# Patient Record
Sex: Male | Born: 1955 | Race: White | Hispanic: No | Marital: Married | Smoking: Current some day smoker
Health system: Southern US, Community
[De-identification: ages and names within clinical notes are randomized; demographics above are authoritative.]

## PROBLEM LIST (undated history)

## (undated) DIAGNOSIS — C4491 Basal cell carcinoma of skin, unspecified: Secondary | ICD-10-CM

## (undated) DIAGNOSIS — Z8719 Personal history of other diseases of the digestive system: Secondary | ICD-10-CM

## (undated) DIAGNOSIS — N419 Inflammatory disease of prostate, unspecified: Secondary | ICD-10-CM

## (undated) DIAGNOSIS — D239 Other benign neoplasm of skin, unspecified: Secondary | ICD-10-CM

## (undated) DIAGNOSIS — M51369 Other intervertebral disc degeneration, lumbar region without mention of lumbar back pain or lower extremity pain: Secondary | ICD-10-CM

## (undated) DIAGNOSIS — M5136 Other intervertebral disc degeneration, lumbar region: Secondary | ICD-10-CM

## (undated) DIAGNOSIS — E669 Obesity, unspecified: Secondary | ICD-10-CM

## (undated) DIAGNOSIS — K5792 Diverticulitis of intestine, part unspecified, without perforation or abscess without bleeding: Secondary | ICD-10-CM

## (undated) DIAGNOSIS — T7840XA Allergy, unspecified, initial encounter: Secondary | ICD-10-CM

## (undated) DIAGNOSIS — N400 Enlarged prostate without lower urinary tract symptoms: Secondary | ICD-10-CM

## (undated) DIAGNOSIS — Z720 Tobacco use: Secondary | ICD-10-CM

## (undated) HISTORY — DX: Other benign neoplasm of skin, unspecified: D23.9

## (undated) HISTORY — DX: Diverticulitis of intestine, part unspecified, without perforation or abscess without bleeding: K57.92

## (undated) HISTORY — DX: Benign prostatic hyperplasia without lower urinary tract symptoms: N40.0

## (undated) HISTORY — DX: Other intervertebral disc degeneration, lumbar region without mention of lumbar back pain or lower extremity pain: M51.369

## (undated) HISTORY — DX: Allergy, unspecified, initial encounter: T78.40XA

## (undated) HISTORY — DX: Inflammatory disease of prostate, unspecified: N41.9

## (undated) HISTORY — DX: Obesity, unspecified: E66.9

## (undated) HISTORY — DX: Basal cell carcinoma of skin, unspecified: C44.91

## (undated) HISTORY — PX: COLONOSCOPY: SHX174

## (undated) HISTORY — DX: Other intervertebral disc degeneration, lumbar region: M51.36

## (undated) HISTORY — DX: Tobacco use: Z72.0

## (undated) HISTORY — PX: BASAL CELL CARCINOMA EXCISION: SHX1214

## (undated) HISTORY — DX: Personal history of other diseases of the digestive system: Z87.19

---

## 2005-05-31 DIAGNOSIS — N419 Inflammatory disease of prostate, unspecified: Secondary | ICD-10-CM

## 2005-05-31 HISTORY — DX: Inflammatory disease of prostate, unspecified: N41.9

## 2008-02-06 ENCOUNTER — Ambulatory Visit (HOSPITAL_BASED_OUTPATIENT_CLINIC_OR_DEPARTMENT_OTHER): Admission: RE | Admit: 2008-02-06 | Discharge: 2008-02-06 | Payer: Self-pay | Admitting: General Surgery

## 2008-05-31 HISTORY — PX: UMBILICAL HERNIA REPAIR: SHX196

## 2009-03-20 ENCOUNTER — Emergency Department (HOSPITAL_COMMUNITY): Admission: EM | Admit: 2009-03-20 | Discharge: 2009-03-20 | Payer: Self-pay | Admitting: Emergency Medicine

## 2010-05-31 DIAGNOSIS — K5792 Diverticulitis of intestine, part unspecified, without perforation or abscess without bleeding: Secondary | ICD-10-CM

## 2010-05-31 HISTORY — DX: Diverticulitis of intestine, part unspecified, without perforation or abscess without bleeding: K57.92

## 2010-10-13 NOTE — Op Note (Signed)
[name], Chase Morgan               ACCOUNT NO.:  [id number]   MEDICAL RECORD NO.:  [id number]          PATIENT TYPE:  AMB   LOCATION:  DSC                          FACILITY:  [facility]   PHYSICIAN:  Almond Lint, MD       DATE OF BIRTH:  [birth year]   DATE OF PROCEDURE:  02/06/2008  DATE OF DISCHARGE:                               OPERATIVE REPORT   PREOPERATIVE DIAGNOSIS:  Umbilical hernia.   POSTOPERATIVE DIAGNOSIS:  Umbilical hernia.   PROCEDURE PERFORMED:  Umbilical hernia repair with Ventralex 4.5-cm  mesh.   SURGEON:  Almond Lint, MD   ASSISTANT:  None.   ANESTHESIA:  General and local.   FINDINGS:  A 2-cm hernia defect, the umbilicus with omentum and the  hernia sac.   SPECIMENS:  None.   ESTIMATED BLOOD LOSS:  Minimal.   COMPLICATIONS:  None.   PROCEDURE:  Chase Morgan was identified in the holding area and taken to  the operating room where he was placed supine on the operating room  table.  General endotracheal anesthesia was induced.  The abdomen was  prepped and draped in a sterile fashion.  Time-out was performed  confirming the patient, the site of surgery, equipment, operating room  staff, and antibiotic administration.  These were cracked and the  operation was commenced.   Marcaine 0.25% with 1% lidocaine with epinephrine was infiltrated into  the infraumbilical and periumbilical regions.  The incision was then  made in transverse curvilinear infraumbilical location.  The Bovie  electrocautery was used to dissect the subcutaneous fat.  The skin  overlying the hernia was elevated using an Allis clamp.  The  subcutaneous tissue was then dissected away from the hernia sac.  A  finger was passed around posterior to the hernia sac.  A Penrose was  placed around it.  At this point, the skin was taken off the hernia sac.  The fascia was then elevated using Kocher and digital palpation was used  to ensure that there was no adherence of the hernia sac to the  posterior  fascia.  Several bands were released using the Bovie electrocautery.  The hernia was then reduced into the abdomen and eight 2-0 Prolene were  used to secure the Ventralex mesh posterior to the fascia with the Gore-  Tex side down.  These were secured.  The fascia was closed over the mesh  with 2-0 Vicryl interrupted sutures.  The umbilical skin was then  reattached to the fascia using a 3-0 Vicryl pop.  The skin incision was  reapproximated using  3-0 Vicryl in a deep dermal interrupted fashion.  The skin edges were  reapproximated using a running 4-0 Vicryl subcuticular suture.  The  wound was cleaned and then dressed using benzoin, Steri-Strips, gauze,  and Tegaderm.  The patient was awake under anesthesia and taken to PACU  in good condition.      Almond Lint, MD  Electronically Signed     FB/MEDQ  D:  02/06/2008  T:  02/07/2008  Job:  [id number]

## 2010-10-30 ENCOUNTER — Inpatient Hospital Stay (INDEPENDENT_AMBULATORY_CARE_PROVIDER_SITE_OTHER)
Admission: RE | Admit: 2010-10-30 | Discharge: 2010-10-30 | Disposition: A | Discharge status: Home / Self Care | Payer: Self-pay | Source: Ambulatory Visit | Attending: Family Medicine | Admitting: Family Medicine

## 2010-10-30 ENCOUNTER — Ambulatory Visit (INDEPENDENT_AMBULATORY_CARE_PROVIDER_SITE_OTHER): Payer: Self-pay

## 2010-10-30 DIAGNOSIS — R300 Dysuria: Secondary | ICD-10-CM

## 2010-10-30 DIAGNOSIS — K5900 Constipation, unspecified: Secondary | ICD-10-CM

## 2010-10-30 LAB — POCT URINALYSIS DIP (DEVICE)
Nitrite: NEGATIVE
Protein, ur: NEGATIVE mg/dL
Urobilinogen, UA: 0.2 mg/dL (ref 0.0–1.0)
pH: 5.5 (ref 5.0–8.0)

## 2010-10-31 LAB — URINE CULTURE: Culture  Setup Time: 201206011546

## 2010-11-18 ENCOUNTER — Inpatient Hospital Stay (HOSPITAL_COMMUNITY)
Admission: EM | Admit: 2010-11-18 | Discharge: 2010-11-23 | DRG: 392 | Disposition: A | Discharge status: Home / Self Care | Payer: Self-pay | Attending: Internal Medicine | Admitting: Internal Medicine

## 2010-11-18 ENCOUNTER — Encounter: Payer: Self-pay | Admitting: Internal Medicine

## 2010-11-18 ENCOUNTER — Emergency Department (HOSPITAL_COMMUNITY): Payer: Self-pay

## 2010-11-18 DIAGNOSIS — N419 Inflammatory disease of prostate, unspecified: Secondary | ICD-10-CM | POA: Diagnosis present

## 2010-11-18 DIAGNOSIS — E663 Overweight: Secondary | ICD-10-CM | POA: Diagnosis present

## 2010-11-18 DIAGNOSIS — G4730 Sleep apnea, unspecified: Secondary | ICD-10-CM | POA: Diagnosis present

## 2010-11-18 DIAGNOSIS — K612 Anorectal abscess: Secondary | ICD-10-CM | POA: Diagnosis present

## 2010-11-18 DIAGNOSIS — K5732 Diverticulitis of large intestine without perforation or abscess without bleeding: Principal | ICD-10-CM | POA: Diagnosis present

## 2010-11-18 DIAGNOSIS — Z87891 Personal history of nicotine dependence: Secondary | ICD-10-CM

## 2010-11-18 DIAGNOSIS — K5289 Other specified noninfective gastroenteritis and colitis: Secondary | ICD-10-CM | POA: Diagnosis present

## 2010-11-18 DIAGNOSIS — N2889 Other specified disorders of kidney and ureter: Secondary | ICD-10-CM | POA: Diagnosis present

## 2010-11-18 DIAGNOSIS — N135 Crossing vessel and stricture of ureter without hydronephrosis: Secondary | ICD-10-CM | POA: Diagnosis present

## 2010-11-18 DIAGNOSIS — K5900 Constipation, unspecified: Secondary | ICD-10-CM | POA: Diagnosis present

## 2010-11-18 DIAGNOSIS — Z6836 Body mass index (BMI) 36.0-36.9, adult: Secondary | ICD-10-CM

## 2010-11-18 LAB — URINALYSIS, ROUTINE W REFLEX MICROSCOPIC
Ketones, ur: 15 mg/dL — AB
Leukocytes, UA: NEGATIVE
Nitrite: NEGATIVE
pH: 5 (ref 5.0–8.0)

## 2010-11-18 LAB — POCT I-STAT, CHEM 8
BUN: 15 mg/dL (ref 6–23)
Creatinine, Ser: 1 mg/dL (ref 0.50–1.35)
Potassium: 3.9 mEq/L (ref 3.5–5.1)
Sodium: 137 mEq/L (ref 135–145)
TCO2: 24 mmol/L (ref 0–100)

## 2010-11-18 LAB — CBC
Hemoglobin: 12.2 g/dL — ABNORMAL LOW (ref 13.0–17.0)
MCH: 29.3 pg (ref 26.0–34.0)
MCV: 85.6 fL (ref 78.0–100.0)
RBC: 4.16 MIL/uL — ABNORMAL LOW (ref 4.22–5.81)

## 2010-11-18 LAB — COMPREHENSIVE METABOLIC PANEL
BUN: 13 mg/dL (ref 6–23)
CO2: 25 mEq/L (ref 19–32)
Calcium: 8.4 mg/dL (ref 8.4–10.5)
Creatinine, Ser: 0.7 mg/dL (ref 0.50–1.35)
GFR calc Af Amer: >60 mL/min (ref >60)
GFR calc non Af Amer: >60 mL/min (ref >60)
Glucose, Bld: 89 mg/dL (ref 70–99)

## 2010-11-18 LAB — ETHANOL: Alcohol, Ethyl (B): <11 mg/dL (ref 0–11)

## 2010-11-18 LAB — URINE MICROSCOPIC-ADD ON

## 2010-11-18 MED ORDER — IOHEXOL 300 MG/ML  SOLN
80.0000 mL | Freq: Once | INTRAMUSCULAR | Status: AC | PRN
  Administered 2010-11-18: 80 mL via INTRAVENOUS

## 2010-11-18 NOTE — Progress Notes (Unsigned)
Hospital Admission Note Date: 11/18/2010  Patient name: Chase Morgan Medical record number: [id number] Date of birth: [birth year] Age: 55 y.o. Gender: male PCP: No primary provider on file.  Medical Service:  Attending physician:   Dr. Margarito Liner Resident [phone]):   Dr. Scot Dock [phone] Resident (R1):   Dr. Dorthula Rue  Chief Complaint: abdominal pain  History of Present Illness: Patient is 55 year old male with no significant past medical history except recurrent prostatitis episodes, who presents most: EGD with Main concern of progressively worsening lower quadrant abdominal pain. Patient describes pain as persistent, uncomfortable specifically in suprapubic area with radiation to left and right lower quadrants, anywhere from 5/10 - 10/10 in severity, approximately 3 weeks in duration, associated with fevers and chills, diaphoresis, nausea. Patient reports seeing urologist because he initially felt that this was another prostatitis episodes. His urologist prescribed him ciprofloxacin and patient reports being compliant with the medication but his pain persisted. He also reports continuous dysuria, hematuria, urinary dribbling and feeling of incomplete voiding. In addition he reports having severe constipation over the period of one month. Over-the-counter medications provided no benefit, medications he tried include MiraLax and citrulline powders, Colace, suppositories. Patient denies similar episodes of this type of pain in the past and even his recurrent prostatitis episodes are not as severe. He denies recent sicknesses or hospitalizations, no episodes of chest pain or shortness of breath, no blood in stool. Patient denies other systemic symptoms of weight loss, changes in appetite.  NO HOME MEDICATIONS    ALLERGIES: NKDA    PAST MEDICAL HISTORY: None  FAMILY HISTORY:  History   Social History  . Marital Status: Married   Occupational History  . [employer], currently employed and  self ensured   Social History Main Topics  . Smoking status: Denies  . Smokeless tobacco: Denies  . Alcohol Use: Denies  . Drug Use: Denies   Patient has family history of colon cancer, his father was diagnosed at age 11. His father died from heart failure. He has brother that lives in [place] and has no medical problems. Patient has no other significant family medical problems.  Review of Systems:  Constitutional: Positive for fever, chills, diaphoresis as per HPI, no appetite change and fatigue.  HEENT: Denies photophobia, eye pain, redness, hearing loss, ear pain, congestion, sore throat, rhinorrhea, sneezing, mouth sores, trouble swallowing, neck pain, neck stiffness and tinnitus.  Respiratory: Denies SOB, DOE, cough, chest tightness, and wheezing.  Cardiovascular: Denies chest pain, palpitations and leg swelling.  Gastrointestinal: Positive for nausea but no vomiting, has lower quadrants abdominal pain as described in HPI,  denies diarrhea, reports constipation, denies blood in stool and abdominal distention.  Genitourinary: Positive for dysuria, urgency, frequency, hematuria, and difficulty urinating, denies flank pain Musculoskeletal: Denies myalgias, back pain, joint swelling, arthralgias and gait problem.  Skin: Denies pallor, rash and wound.  Neurological: Denies dizziness, seizures, syncope, weakness, light-headedness, numbness and headaches.  Hematological: Denies adenopathy. Easy bruising, personal or family bleeding history  Psychiatric/Behavioral: Denies suicidal ideation, mood changes, confusion, nervousness, sleep disturbance and agitation  Physical Exam:  T =98.3 F BP = 123/85 mmHg HR = 72/min RR = 16/min O2 sat = 98% on RA  Constitutional: Vital signs reviewed.  Patient is a well-developed and well-nourished in no acute distress and cooperative with exam. Alert and oriented x3.  Head: Normocephalic and atraumatic Ear: TM normal bilaterally Mouth: no erythema  or exudates, MMM Eyes: PERRL, EOMI, conjunctivae normal, No scleral icterus.  Neck: Supple, Trachea midline  normal ROM, No JVD, mass, thyromegaly, or carotid bruit present.  Cardiovascular: RRR, S1 normal, S2 normal, no MRG, pulses symmetric and intact bilaterally Pulmonary/Chest: CTAB, no wheezes, rales, or rhonchi Abdominal: Soft. Slightly distended, tenderness in lower quadrants bilaterally and worse in the suprapubic area, soft bowel sounds, no masses, organomegaly, or guarding present.  GU: no CVA tenderness Musculoskeletal: No joint deformities, erythema, or stiffness, ROM full and no nontender Hematology: no cervical, inginal, or axillary adenopathy.  Neurological: A&O x3, Strenght is normal and symmetric bilaterally, cranial nerve II-XII are grossly intact, no focal motor deficit, sensory intact to light touch bilaterally.  Skin: Warm, dry and intact. No rash, cyanosis, or clubbing.  Psychiatric: Normal mood and affect. speech and behavior is normal. Judgment and thought content normal. Cognition and memory are normal.   Lab results:   WBC                                      12.4       h      4.0-10.5         K/uL  RBC                                      4.16       l      4.22-5.81        MIL/uL  Hemoglobin (HGB)                         12.2       l      13.0-17.0        g/dL  Hematocrit (HCT)                         35.6       l      39.0-52.0        %  MCV                                      85.6              78.0-100.0       fL  MCH -                                    29.3              26.0-34.0        pg  MCHC                                     34.3              30.0-36.0        g/dL  RDW                                      13.2              11.5-15.5        %  Platelet Count (PLT)                     275               150-400          K/uL   TCO2                                     24                0-100            mmol/L  Ionized Calcium                          1.10        l      1.12-1.32        mmol/L  Hemoglobin (HGB)                         12.6       l      13.0-17.0        g/dL  Hematocrit (HCT)                         37.0       l      39.0-52.0        %  Sodium (NA)                              137               135-145          mEq/L  Potassium (K)                            3.9               3.5-5.1          mEq/L  Chloride                                 104               96-112           mEq/L  Glucose                                  98                70-99            mg/dL  BUN                                      15                6-23             mg/dL  Creatinine  1.00              0.50-1.35        Mg/dL   Occult Blood, Fecal                      NEGATIVE   Color, Urine                             AMBER      a      YELLOW    BIOCHEMICALS MAY BE AFFECTED BY COLOR  Appearance                               CLOUDY     a      CLEAR  Specific Gravity                         1.023             1.005-1.030  pH                                       5.0               5.0-8.0  Urine Glucose                            NEGATIVE          NEG              mg/dL  Bilirubin                                SMALL      a      NEG  Ketones                                  15         a      NEG              mg/dL  Blood                                    MODERATE   a      NEG  Protein                                  >300       a      NEG              mg/dL  Urobilinogen                             1.0               0.0-1.0          mg/dL  Nitrite  NEGATIVE          NEG  Leukocytes                               NEGATIVE          NEG   Casts / HPF                              SEE NOTE.  a      NEG    HYALINE CASTS  WBC / HPF                                0-2               <3               WBC/hpf  RBC / HPF                                3-6               <3               RBC/hpf  Bacteria / HPF                            MANY       a      RARE  Urine-Other                              SEE NOTE.    MUCOUS PRESENT  Imaging results:   CT of the abdomen and pelvis:    1.  Findings are most consistent with severe sigmoid diverticulitis   with focal colitis and atypical neoplasm considered less likely.   There is a 5.3 cm perirectal fluid collection which may reflect an   abscess.   2. Mild left-sided ureterectasis with delayed contrast excretion   consistent with partial ureteral obstruction secondary to the left   lower quadrant inflammatory process.   3.  No evidence of appendicitis or bowel obstruction.  Assessment & Plan by Problem:  1) abdominal pain -associated with fevers and chills, nausea, and CT findings noted above this is certainly worrisome for diverticulitis however prostatitis is not entirely excluded and is most likely present as well. It is unclear what the exact etiology of diverticulitis is at this time and it is likely that severe constipation contributed. Occasionally sigmoid diverticulitis can occur in the setting of underlying malignancy specifically in the sigmoid area and given patient's family history of cancer in first degree relative this is certainly to be considered. Per CT findings neoplasm is considered less likely however once patient is discharged and stable will need colonoscopy especially given his family history. Patient did not have colonoscopy to this point. Plan - Admit patient to regular floor and monitor vitals regularly - Followup on admission labs which include complete metabolic panel, urine drug screen, HIV, lipase, alcohol level - Provide supportive care with IV fluids, keep patient n.p.o. for now, morphine every 4 hours as needed for pain, Zofran every 4 hours as needed for nausea - Start broad-spectrum antibiotics, Zosyn is reasonable choice to begin with  with switch to by mouth once patient able to tolerate by mouth diet - Prescribed bowel  regimen, may start with the suppository and continue to by mouth once patient able to tolerate - Discuss with attending if surgery consult necessary given the CT findings of questionable abscess  2) DVT prophylaxis - Heparin 5000 U subQ every 8 hours   [employer]  .

## 2010-11-19 ENCOUNTER — Inpatient Hospital Stay (HOSPITAL_COMMUNITY): Payer: Self-pay

## 2010-11-19 LAB — CBC
Hemoglobin: 11.8 g/dL — ABNORMAL LOW (ref 13.0–17.0)
MCH: 28.6 pg (ref 26.0–34.0)
MCV: 85.9 fL (ref 78.0–100.0)
Platelets: 272 10*3/uL (ref 150–400)
RBC: 4.12 MIL/uL — ABNORMAL LOW (ref 4.22–5.81)
WBC: 10.1 10*3/uL (ref 4.0–10.5)

## 2010-11-19 LAB — URINE CULTURE

## 2010-11-19 LAB — LIPID PANEL
Cholesterol: 124 mg/dL (ref 0–200)
LDL Cholesterol: 80 mg/dL (ref 0–99)
Total CHOL/HDL Ratio: 3.9 RATIO
Triglycerides: 60 mg/dL (ref <150)
VLDL: 12 mg/dL (ref 0–40)

## 2010-11-19 LAB — BASIC METABOLIC PANEL
CO2: 25 mEq/L (ref 19–32)
Chloride: 101 mEq/L (ref 96–112)
Creatinine, Ser: 0.76 mg/dL (ref 0.50–1.35)
Glucose, Bld: 87 mg/dL (ref 70–99)

## 2010-11-20 DIAGNOSIS — R109 Unspecified abdominal pain: Secondary | ICD-10-CM

## 2010-11-20 LAB — CBC
HCT: 36.6 % — ABNORMAL LOW (ref 39.0–52.0)
Hemoglobin: 12.3 g/dL — ABNORMAL LOW (ref 13.0–17.0)
RBC: 4.25 MIL/uL (ref 4.22–5.81)

## 2010-11-20 LAB — BASIC METABOLIC PANEL
CO2: 28 mEq/L (ref 19–32)
Glucose, Bld: 108 mg/dL — ABNORMAL HIGH (ref 70–99)
Potassium: 3.8 mEq/L (ref 3.5–5.1)
Sodium: 137 mEq/L (ref 135–145)

## 2010-11-20 LAB — GLUCOSE, CAPILLARY: Glucose-Capillary: 98 mg/dL (ref 70–99)

## 2010-11-23 ENCOUNTER — Inpatient Hospital Stay (HOSPITAL_COMMUNITY): Payer: Self-pay

## 2010-11-23 DIAGNOSIS — R109 Unspecified abdominal pain: Secondary | ICD-10-CM

## 2010-11-23 LAB — CBC
MCH: 28.5 pg (ref 26.0–34.0)
MCHC: 32.9 g/dL (ref 30.0–36.0)
MCV: 86.7 fL (ref 78.0–100.0)
Platelets: 326 10*3/uL (ref 150–400)
RDW: 13.2 % (ref 11.5–15.5)

## 2010-11-23 LAB — BASIC METABOLIC PANEL
CO2: 27 mEq/L (ref 19–32)
Calcium: 9 mg/dL (ref 8.4–10.5)
Creatinine, Ser: 0.82 mg/dL (ref 0.50–1.35)
Glucose, Bld: 83 mg/dL (ref 70–99)

## 2010-11-23 MED ORDER — IOHEXOL 300 MG/ML  SOLN
100.0000 mL | Freq: Once | INTRAMUSCULAR | Status: AC | PRN
  Administered 2010-11-23: 100 mL via INTRAVENOUS

## 2010-11-26 ENCOUNTER — Encounter: Payer: Self-pay | Admitting: Internal Medicine

## 2010-11-26 DIAGNOSIS — K5792 Diverticulitis of intestine, part unspecified, without perforation or abscess without bleeding: Secondary | ICD-10-CM

## 2010-11-26 DIAGNOSIS — N419 Inflammatory disease of prostate, unspecified: Secondary | ICD-10-CM

## 2010-11-26 NOTE — Progress Notes (Signed)
DOA: 11/18/10 DOD: 11/23/10  Discharge Summary: 55 y/o man  with PMH significant for recurrent prostatitis presented with lower quadrant abdominal pain that was getting worse for 1 week and CT scan showed acute divertculitis and perirectal  fluid collection on 5/20.Patient was  started on IV zosyn , clear liquid diet and surgery consult was placed. He was managed conservatively with Iv antibiotics. IR was consulted for pelvic fluid collection who believed that it was not a true abscess and was not drainable. His WBC started trending down on IV zosyn and he continued to be afebrile. His repeat CT scan on 5/25 showed mild decrease in inflammation with acute diverticulitis. His diet was advanced to regular solids that he tolerated well and was d/c home on cipro and flagyl. We need to make a GI referral for colonoscopy in 4 weeks as a follow up on diverticultis and to rule out other diagnostic considerations like colon cancer.  Follow up labs: CBC  Problem list; reviewed.  Medications: reviewed.

## 2010-11-27 DIAGNOSIS — K5900 Constipation, unspecified: Secondary | ICD-10-CM | POA: Insufficient documentation

## 2010-11-27 DIAGNOSIS — K5792 Diverticulitis of intestine, part unspecified, without perforation or abscess without bleeding: Secondary | ICD-10-CM | POA: Insufficient documentation

## 2010-11-27 DIAGNOSIS — N419 Inflammatory disease of prostate, unspecified: Secondary | ICD-10-CM | POA: Insufficient documentation

## 2010-12-08 NOTE — Consult Note (Signed)
NAMEMarland Morgan  VERNON, MAISH NO.:  [id number]  MEDICAL RECORD NO.:  [id number]  LOCATION:  3002                         FACILITY:  [facility]  PHYSICIAN:  Velora Heckler, MD      DATE OF BIRTH:  03-20-1956  DATE OF CONSULTATION:  11/18/2010 DATE OF DISCHARGE:                                CONSULTATION   REFERRING PHYSICIAN:  [name], MD  PRIMARY CARE PHYSICIAN:  [name], MD  UROLOGIST:  Loraine Leriche C. [name], MD  CHIEF COMPLAINT:  Constipation with decreased bowel movements in last 2 months.  BRIEF HISTORY:  The patient is a 55 year old gentleman who has a history of recurrent prostatitis.  Two months ago, his last episode was treated by Dr. [name] with Cipro.  During that time, his prostatitis symptoms got better but he started having trouble with constipation and has had ongoing problems with this and has not been able to have a proper bowel movement for at least a month-and-half.  His wife is a Engineer, civil (consulting) and they have tried oral laxatives and enemas all without success.  He presented to the ER today complaining of constipation.  He also felt like his prostatitis might be returning.  Workup in the ER included chest x-ray which showed no acute changes and a CT scan which shows findings consistent with severe sigmoid diverticulitis and focal colitis.  An atypical neoplasm is a possibility.  There is also a 5.3 cm perirectal fluid collection which could possibly be an abscess.  In addition to this, he had left-sided ureterectasis with the delayed contrast excretion consistent with a partial ureteral obstruction which they think is secondary to the inflammatory process.  There was no evidence of bowel obstruction or appendicitis.  The liver, spleen, gallbladder, pancreas, and biliary system appeared normal.  White count was 12.4, hemoglobin 12.2, hematocrit 35.6, and platelets 275,000.  Sodium was 137, potassium was 3.9, BUN was 15, creatinine was 1, and glucose  was 98.  UA is cloudy with protein and ketones.  Flat plate from October 30, 2010 just showed constipation.  The patient was seen and evaluated by the Teaching Service and admitted by Dr. Aldine Contes.  PAST MEDICAL HISTORY: 1. Prostatitis. 2. "Bad knees." 3. Overweight, height 71 inches, weight approximately 260.  PAST SURGICAL HISTORY:  He had an umbilical hernia repair by Dr. Donell Beers, February 06, 2008.  FAMILY HISTORY:  Father died with an MI after CABG.  Mother died with hemochromatosis.  One brother in good health.  SOCIAL HISTORY:  He smokes less than a pack a day for the last 34 years. Positive for social alcohol use.  Drugs:  None.  He has worked in [employer] most of his life.  He played football to which he attributes all his joint problems to and he is married.  His wife is with him.  REVIEW OF SYSTEMS:  CONSTITUTIONAL:  Fever.  He had chills on Friday and Saturday.  He had temperature up to 99.3 in the ER.  SKIN:  No changes. VITAL SIGNS:  Weight, he does not think he has really lost any weight but his appetite is somewhat decreased.  PSYCH:  No changes.  CV:  He  has a history of being dizzy on Flomax when he first gets up but he is a not taking that now and also had a problem recently.  No history of syncope of stroke.  PULMONARY:  No orthopnea.  No PND.  He does snore, his wife thinks he may have sleep apnea.  No coughing or wheezing.  No recent URIs or asthma.  CARDIAC:  No history of chest pain or palpitations.  GI:  Positive for GERD.  No nausea, vomiting, and no diarrhea.  Positive for constipation.  No blood in his stool.  GU:  He has tenderness and discomfort voiding.  LOWER EXTREMITIES:  No edema. No claudication symptoms.  MUSCULOSKELETAL:  Positive for knee pain and back pain.  ENDOCRINE:  Negative.  MEDICATIONS:  He had some Cipro left over and he started that and completed what was left over yesterday.  ALLERGIES:  None.  PHYSICAL EXAMINATION:  GENERAL:   This is a well-nourished, overweight white male in no acute distress. VITAL SIGNS:  His initial temperature at 8:41 a.m. was 98.3, heart rate was 88, blood pressure was 123/55, respiratory rate was 18, and sats were 97%. HEAD:  Normocephalic. EARS, NOSE, THROAT, AND MOUTH:  All within normal limits. NECK:  Trachea was midline.  There was no palpable thyroid.  No JVD.  No bruits. CHEST:  Clear to auscultation and percussion, but he was wheezing bilaterally. CARDIAC:  Normal S1-S2.  No murmurs, rubs, or gallops. ABDOMEN:  Soft.  Bowel sounds were present.  There was no hernia, masses, or abscess noted.  He was slightly tender over the midportion of his abdomen over the pubis. GU/RECTAL:  Deferred. LYMPHADENOPATHY:  None palpated, cervical, axillary, or femoral. MUSCULOSKELETAL:  No joint changes noted. SKIN:  No changes. NEUROLOGIC:  No focal deficits.  Cranial nerves II-XII were intact. PSYCH:  Normal affect.  IMPRESSION: 1. Probable sigmoid diverticulitis, focal colitis, and probable     abscess. 2. There is a question of ureter obstruction probably secondary to     inflammatory process. 3. History of prostatitis 4. Sleep apnea per wife. 5. BMI of 36. 6. History of tobacco use.  PLAN:  I have  discuseds with Dr. Gerrit Friends.  He is in agreement with the Zosyn.  We are going to get an IR consult to see if we can drain this abscess and treat medically.  Hopefully, we can get him through this without any surgical intervention at this time.  He will most likely need that portion of his colon removed at some point.  We will follow with you.  Further treatment recommendations as needed.     Eber Hong, P.A.   ______________________________ Velora Heckler, MD    WDJ/MEDQ  D:  11/18/2010  T:  11/18/2010  Job:  [id number]  cc:   [name], MD Veverly Fells. [name], M.D.  Electronically Signed by Sherrie George P.A. on 11/23/2010 04:07:42 PM Electronically Signed by  Darnell Level MD on 12/08/2010 10:59:11 AM

## 2010-12-09 ENCOUNTER — Encounter: Payer: Self-pay | Admitting: Internal Medicine

## 2010-12-09 ENCOUNTER — Ambulatory Visit (INDEPENDENT_AMBULATORY_CARE_PROVIDER_SITE_OTHER): Payer: Self-pay | Admitting: Internal Medicine

## 2010-12-09 VITALS — BP 126/81 | HR 97 | Temp 97.9°F | Ht 71.0 in | Wt 261.3 lb

## 2010-12-09 DIAGNOSIS — K5792 Diverticulitis of intestine, part unspecified, without perforation or abscess without bleeding: Secondary | ICD-10-CM

## 2010-12-09 DIAGNOSIS — K5732 Diverticulitis of large intestine without perforation or abscess without bleeding: Secondary | ICD-10-CM

## 2010-12-09 DIAGNOSIS — K5900 Constipation, unspecified: Secondary | ICD-10-CM

## 2010-12-09 LAB — CBC WITH DIFFERENTIAL/PLATELET
Basophils Absolute: 0 10*3/uL (ref 0.0–0.1)
Basophils Relative: 0 % (ref 0–1)
HCT: 40.2 % (ref 39.0–52.0)
MCHC: 32.8 g/dL (ref 30.0–36.0)
Monocytes Absolute: 0.7 10*3/uL (ref 0.1–1.0)
Neutro Abs: 5.2 10*3/uL (ref 1.7–7.7)
Neutrophils Relative %: 61 % (ref 43–77)
Platelets: 229 10*3/uL (ref 150–400)
RDW: 15.6 % — ABNORMAL HIGH (ref 11.5–15.5)

## 2010-12-09 NOTE — Progress Notes (Signed)
I saw patient and discussed his care with resident Dr. Brown.  I agree with the clinical findings and plans as outlined in his note. 

## 2010-12-09 NOTE — Assessment & Plan Note (Addendum)
Resolved, no abdominal pain, nausea/vomiting, diarrhea/constipation, blood in stool. -will check CBC today -scheduled appointment with GI for follow-up colonoscopy in 1-2 months -patient instructed to stop taking any remaining cipro/flagyl -patient reassured that the area seen on CT was likely just perirectal fluid buildup due to the acute diverticulitis, and not a true abscess

## 2010-12-09 NOTE — Assessment & Plan Note (Signed)
Constipation has resolved, patient reports 2-3 soft, formed bowel movements/day -discontinue suppository -encouraged patient to continue eating a high-fiber diet

## 2010-12-09 NOTE — Progress Notes (Signed)
New Patient Visit, Intern Note  HPI: The patient is a 55 yo man with a history of prostatitis, recently hospitalized 6/20-6/25 for diverticulitis, presenting for hospital follow-up.  Since discharge, the patient has advanced himself to a full diet, tolerating foods such as chicken, beef, and vegetables.  He notes no abdominal pain or cramping, nausea, vomiting, fevers, or chills.  He notes completing his course of flagyl after 10 days, but has continued to take cipro to present date because he had some tablets left over from his prescription for prostatitis.  He now has 2-3 soft, formed, non-bloody bowel movements/day.  He notes no dysuria, urgency, frequency, hematuria, or incomplete voiding.  He says he feels "great".  His only concern is that he believes he had an abscess on CT, and wonders if that will need to be drained.  ROS: General: no fevers, chills, changes in weight, changes in appetite Skin: no rash HEENT: no blurry vision, hearing changes, sore throat Pulm: no dyspnea, coughing, wheezing CV: no chest pain, palpitations, shortness of breath Abd: no abdominal pain, nausea/vomiting, diarrhea/constipation Ext: no arthralgias, myalgias Neuro: no weakness, numbness, or tingling  PMH Prostatitis - x4-5 years, with 1-2 episodes of prostatitis/year, typically treated with cipro  Surg Hx: Umbilical hernia repair 1-2 years ago  FH: Mother - hemochromatosis Father - Heart disease s/p coronary bypass in age 77's, colon cancer in age 54's  SH: Works in [employer] Smoked <1 pack/day for 33 years, but has cut back to <1/2 pack/day since discharge, says he knows he needs to quit but is not yet ready Drinks 2-4 drinks of alcohol/week No other illicits  Filed Vitals:   12/09/10 1340  BP: 126/81  Pulse: 97  Temp: 97.9 F (36.6 C)    PEX General: alert, cooperative, and in no apparent distress HEENT: pupils equal round and reactive to light, vision grossly intact, oropharynx clear  and non-erythematous  Neck: supple, no lymphadenopathy, JVD, or carotid bruits Lungs: clear to ascultation bilaterally, normal work of respiration, no wheezes, rales, ronchi Heart: regular rate and rhythm, no murmurs, gallops, or rubs Abdomen: soft, non-tender, non-distended, normal bowel sounds Msk: no joint edema, warmth, or erythema Extremities: no cyanosis, clubbing, or edema Neurologic: alert & oriented X3, cranial nerves II-XII intact, strength 5/5 throughout, sensation intact to light touch   Assessment/Plan

## 2010-12-09 NOTE — Patient Instructions (Signed)
Stop taking any remaining Ciprofloxacin or Flagyl (antibiotics) tablets, since your episode of diverticulitis has resolved.    -return to the hospital or call 911 if you experience recurrent abdominal pain, fevers, nausea, vomiting, or bloody bowel movements  Schedule an appointment with Gastroenterology for a follow-up colonoscopy, which will also count as your screening colonoscopy  Please make an appointment to return for a follow-up visit in about 3 months.  Diverticulosis Diverticulosis is a common condition that develops when small pouches (diverticula) form in the wall of the colon. The risk of diverticulosis increases with age. It happens more often in people who eat a low-fiber diet. Most individuals with diverticulosis have no symptoms. Those individuals with symptoms usually experience belly (abdominal) pain, constipation, or loose stools (diarrhea). HOME CARE INSTRUCTIONS  Increase the amount of fiber in your diet as directed by your caregiver or dietician. This may reduce symptoms of diverticulosis.   Your caregiver may recommend taking a dietary fiber supplement.   Drink at least 6 to 8 glasses of water each day to prevent constipation.   Try not to strain when you have a bowel movement.   Your caregiver may recommend avoiding nuts and seeds to prevent complications, although this is still an uncertain benefit.   Only take over-the-counter or prescription medicines for pain, discomfort, or fever as directed by your caregiver.  FOODS HAVING HIGH FIBER CONTENT INCLUDE:  Fruits. Apple, peach, pear, tangerine, raisins, prunes.   Vegetables. Brussels sprouts, asparagus, broccoli, cabbage, carrot, cauliflower, romaine lettuce, spinach, summer squash, tomato, winter squash, zucchini.   Starchy Vegetables. Baked beans, kidney beans, lima beans, split peas, lentils, potatoes (with skin).   Grains. Whole wheat bread, [place] rice, bran flake cereal, plain oatmeal, white rice, shredded  wheat, bran muffins.  SEEK IMMEDIATE MEDICAL CARE IF:  You develop increasing pain or severe bloating.   You have an oral temperature above 100.5, not controlled by medicine.   You develop vomiting or bowel movements that are bloody or black.  Document Released: 02/12/2004 Document Re-Released: 11/04/2009 [facility] Patient Information 2011 [place], [place].  Prostatitis Prostatitis is an inflammation (the body's way of reacting to injury and/or infection) of the prostate gland. The prostate gland is a male organ. The gland is about the size and shape of a walnut. The prostate is located just below the bladder. It produces semen, which is a fluid that helps nourish and transport sperm. Prostatitis is the most common urinary tract problem in men younger than age 24. There are 4 categories of prostatitis:  I - Acute bacterial prostatitis.   II - Chronic bacterial prostatitis.   III - Chronic prostatitis and chronic pelvic pain syndrome (CPPS).   Inflammatory.   Non inflammatory.   IV - Asymptomatic inflammatory prostatitis.  Acute and chronic bacterial prostatitis are problems with bacterial infections of the prostate. "Acute" infection is usually a one-time problem. "Chronic" bacterial prostatitis is a condition with recurrent infection. It is usually caused by the same germ (bacteria). CPPS has symptoms similar to prostate infection. However, no infection is actually found. This condition can cause problems of ongoing pain. Currently, it cannot be cured. Treatments are available and aimed at symptom control.  Asymptomatic inflammatory prostatitis has no symptoms. It is a condition where infection-fighting cells are found by chance in the urine. The diagnosis is made most often during an exam for other conditions. Other conditions could be infertility or a high level of PSA (prostate-specific antigen) in the blood. SYMPTOMS Symptoms can vary depending  upon the type of prostatitis that  exists. There can also be overlap in symptoms. This can make diagnosis difficult. Symptoms: For Acute bacterial prostatitis  Painful urination.  Fever and/or chills.   Muscle and/or joint pains.   Low back pain.   Low abdominal pain.   Inability to empty bladder completely.   Sudden urges to urinate.  Frequent urination during the day.   Difficulty starting urine stream.   Need to urinate several times at night (nocturia).   Weak urine stream.   Urethral (tube that carries urine from the bladder out of the body) discharge and dribbling after urination.   For Chronic bacterial prostatitis  Rectal pain.   Pain in the testicles, penis, or tip of the penis.   Pain in the space between the anus and scrotum (perineum).   Low back pain.   Low abdominal pain.   Problems with sexual function.   Painful ejaculation.   Bloody semen.   Inability to empty bladder completely.   Painful urination.   Sudden urges to urinate.   Frequent urination during the day.   Difficulty starting urine stream.   Need to urinate several times at night (nocturia).   Weak urine stream.   Dribbling after urination.   Urethral discharge.   For Chronic prostatitis and chronic pelvic pain syndrome (CPPS) Symptoms are the same as those for chronic bacterial prostatitis. Problems with sexual function are often the reason for seeking care. This important problem should be discussed with your caregiver. For Asymptomatic inflammatory prostatitis As noted above, there are no symptoms with this condition. DIAGNOSIS  Your caregiver may perform a rectal exam. This exam is to determine if the prostate is swollen and tender.   Sometimes blood work is performed. This is done to see if your white blood cell count is elevated. The Prostate Specific Antigen (PSA) is also measured. PSA is a blood test that can help detect early prostate cancer.   A urinalysis is done to find out what type of  infection is present if this is a suspected cause. An additional urinalysis may be done after a digital rectal exam. This is to see if white blood cells are pushed out of the prostate and into the urine. A low-grade infection of the prostate may not be found on the first urinalysis.  In more difficult cases, your caregiver may advise other tests. Tests could include:  Urodynamics -- Tests the function of the bladder and the organs involved in triggering and controlling normal urination.   Urine flow rate.   Cystoscopy -- In this procedure, a thin, telescope-like tube with a light and tiny camera attached (cystoscope) is inserted into the bladder through the urethra. This allows the caregiver to see the inside of the urethra and bladder.   Electromyography -- This procedure tests how the muscles and nerves of the bladder work. It is focused on the muscles that control the anus and pelvic floor. These are the muscles between the anus and scrotum.  In people who show no signs of infection, certain uncommon infections might be causing constant or recurrent symptoms. These uncommon infections are difficult to detect. More work in medicine may help find solutions to these problems. TREATMENT Antibiotics are used to treat infections caused by germs. If the infection is not treated and becomes long lasting (chronic), it may become a lower grade infection with minor, continual problems. Without treatment, the prostate may develop a boil or furuncle (abscess). This may require surgical treatment.  For those with chronic prostatitis and CPPS, it is important to work closely with your primary caregiver and urologist. For some, the medicines that are used to treat a non-cancerous, enlarged prostate (benign prostatic hypertrophy) may be helpful. Referrals to specialists other than urologists may be necessary. In rare cases when all treatments have been inadequate for pain control, an operation to remove the prostate  may be recommended. This is very rare and before this is considered thorough discussion with your urologist is highly recommended.  In cases of secondary to chronic non-bacterial prostatitis, a good relationship with your urologist or primary caregiver is essential because it is often a recurrent prolonged condition that requires a good understanding of the causes and a commitment to therapy aimed at controlling your symptoms. HOME CARE INSTRUCTIONS  Hot sitz baths for 20 minutes, 4 times per day, may help relieve pain.   Non-prescription pain killers may be used as your caregiver recommends if you have no allergies to them. Some illnesses or conditions prevent use of non-prescription drugs. If unsure, check with your caregiver. Take all medications as directed. Take the antibiotics for the prescribed length of time, even if you are feeling better.  SEEK MEDICAL CARE IF:  You have any worsening of the symptoms that originally brought you to your caregiver.   You have an oral temperature above 100.5.   You experience any side effects from medications prescribed.  SEEK IMMEDIATE MEDICAL CARE IF:  You have an oral temperature above 100.5, not controlled by medicine.   You have pain not relieved with medications.   You develop nausea, vomiting, lightheadedness, or have a fainting episode.   You are unable to urinate.   You pass bloody urine or clots.  Document Released: 05/14/2000 Document Re-Released: 08/11/2009 [facility] Patient Information 2011 [place], [place].

## 2010-12-14 ENCOUNTER — Telehealth: Payer: Self-pay | Admitting: *Deleted

## 2010-12-14 NOTE — Telephone Encounter (Signed)
Please call patient and let him know that if he is having recurrent stomach pain, he should present to clinic for evaluation.  Please inform him to STOP taking cipro immediately, and to return to a clear liquid diet (eating foods such as jello, clear juices, etc.).  Please make an appointment for this patient to come to clinic later today or tomorrow (you could add him on to my schedule for tomorrow morning, since I know this patient.  I would be happy to see him).

## 2010-12-14 NOTE — Telephone Encounter (Signed)
Pt calls and states his gi problems are starting up again, he is having that "gnawing" discomfort in the lower part of his abd and had sweats this weekend one night and some slight problems w/ BM's. He states he has cipro from urologist and started that this weekend but he needs the flagyl called in. He is informed he may need an appt but he would rather avoid that if possible. Please advise

## 2010-12-14 NOTE — Telephone Encounter (Signed)
Agree with plan 

## 2010-12-14 NOTE — Telephone Encounter (Signed)
Spoke w/ pt he will come to clinic for appt at 1045 7/17 tues, also gave him directions to stop the cipro and to start clear liquids, he repeated instructions back and is agreeable.

## 2010-12-15 ENCOUNTER — Encounter: Payer: Self-pay | Admitting: Internal Medicine

## 2010-12-15 ENCOUNTER — Ambulatory Visit (INDEPENDENT_AMBULATORY_CARE_PROVIDER_SITE_OTHER): Payer: Self-pay | Admitting: Internal Medicine

## 2010-12-15 ENCOUNTER — Encounter: Payer: Self-pay | Admitting: Ophthalmology

## 2010-12-15 ENCOUNTER — Inpatient Hospital Stay (HOSPITAL_COMMUNITY)
Admission: AD | Admit: 2010-12-15 | Discharge: 2010-12-16 | DRG: 392 | Disposition: A | Discharge status: Home / Self Care | Payer: Self-pay | Source: Ambulatory Visit | Attending: Internal Medicine | Admitting: Internal Medicine

## 2010-12-15 DIAGNOSIS — K5732 Diverticulitis of large intestine without perforation or abscess without bleeding: Principal | ICD-10-CM | POA: Diagnosis present

## 2010-12-15 DIAGNOSIS — K630 Abscess of intestine: Secondary | ICD-10-CM | POA: Diagnosis present

## 2010-12-15 DIAGNOSIS — R109 Unspecified abdominal pain: Secondary | ICD-10-CM | POA: Insufficient documentation

## 2010-12-15 DIAGNOSIS — K5900 Constipation, unspecified: Secondary | ICD-10-CM | POA: Diagnosis present

## 2010-12-15 DIAGNOSIS — F17200 Nicotine dependence, unspecified, uncomplicated: Secondary | ICD-10-CM | POA: Diagnosis present

## 2010-12-15 DIAGNOSIS — N419 Inflammatory disease of prostate, unspecified: Secondary | ICD-10-CM | POA: Diagnosis present

## 2010-12-15 LAB — DIFFERENTIAL
Basophils Absolute: 0 10*3/uL (ref 0.0–0.1)
Lymphocytes Relative: 17 % (ref 12–46)
Neutro Abs: 7.2 10*3/uL (ref 1.7–7.7)

## 2010-12-15 LAB — URINALYSIS, ROUTINE W REFLEX MICROSCOPIC
Glucose, UA: NEGATIVE mg/dL
Leukocytes, UA: NEGATIVE
Protein, ur: >300 mg/dL — AB
Urobilinogen, UA: 1 mg/dL (ref 0.0–1.0)

## 2010-12-15 LAB — COMPREHENSIVE METABOLIC PANEL
Alkaline Phosphatase: 90 U/L (ref 39–117)
BUN: 14 mg/dL (ref 6–23)
CO2: 26 mEq/L (ref 19–32)
Chloride: 102 mEq/L (ref 96–112)
GFR calc Af Amer: >60 mL/min (ref >60)
GFR calc non Af Amer: >60 mL/min (ref >60)
Glucose, Bld: 93 mg/dL (ref 70–99)
Potassium: 4.1 mEq/L (ref 3.5–5.1)
Total Bilirubin: 0.6 mg/dL (ref 0.3–1.2)

## 2010-12-15 LAB — CBC
Platelets: 220 10*3/uL (ref 150–400)
RDW: 15.1 % (ref 11.5–15.5)
WBC: 10.1 10*3/uL (ref 4.0–10.5)

## 2010-12-15 LAB — APTT: aPTT: 30 seconds (ref 24–37)

## 2010-12-15 LAB — URINALYSIS, MICROSCOPIC ONLY

## 2010-12-15 NOTE — Patient Instructions (Signed)
We are concerned that you may have another episode of diverticulitis.  PLEASE RETURN TO THE HOSPITAL AS SOON AS POSSIBLE for further evaluation and treatment. -until you return, please eat a clear liquid diet (jello, ginger ale, any liquid you can see through)

## 2010-12-15 NOTE — Assessment & Plan Note (Addendum)
Patient reports a 2-day history of lower abdominal/suprapubic pain, and dysuria and incomplete voiding with a normal rectal exam.  Concern for recurrence of diverticulitis, less likely prostatitis vs gastroenteritis -a large amount of time was spent explaining our concern for diverticulitis with the patient.  The patient expressed concern that he did not have insurance, and did not know how he was going to pay for his previous hospital bill, much less another one.  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  It was expressed to the patient that if he decided to leave AMA, we would strongly encourage him to return to this hospital this evening, which the patient seemed amenable to doing. -plan for direct admission when patient returns -will likely need CT abd/pelvis to evaluate for diverticulitis -cbc, cmp, blood culture, coags, UA, urinalysis drawn in clinic, results will be sent to inpatient.

## 2010-12-15 NOTE — Progress Notes (Signed)
I saw patient and discussed his care with resident Dr. Brown.  I agree with the clinical findings and plans as outlined in his note. 

## 2010-12-15 NOTE — Progress Notes (Signed)
HPI The patient is a 55 yo man with a history of prostatitis, recently hospitalized 6/20-6/25 for diverticulitis, presenting with abdominal pain.  The patient was discharged 6/25 with a diagnosis of diverticulitis, on a 10-day course of cipro and flagyl.  The patient completed his flagyl course after 10 days, but continued to take cipro (using some leftover tablets from a prior prescription for prostatitis) until his follow-up appointment on 7/11.  At that appointment, the patient was asymptomatic and eating a full diet (see note from 12/09/10), and his cipro was discontinued.  The patient remained asymptomatic until 7/14, when he experienced 5 episodes of non-bloody diarrhea, which he attributed to catching a viral gastroenteritis from his mother, and experienced significant night sweats that night (patient did not take his temperature).  On 7/15, he developed lower abdominal discomfort, similar to the discomfort associated with his previous episode of diverticulitis, present as a midline lower abdominal/suprapubic "discomfort", worsened by movement, radiating to his penis.  He also developed a burning sensation with urination, and noted incomplete voiding.  He continued to pass gas, but had no further bowel movements.  He was eating a full diet.  He re-started his cipro medication using his leftover pills.  On 7/16, his symptoms continued, and he called our clinic asking for a flagyl prescription, out of concern for recurrent diverticulitis.  He was informed to stop taking cipro, to revert to a clear liquid diet, and to present to clinic as soon as possible.  He presents this morning (7/17) with persistent symptoms.  No nausea, vomiting, fevers.  We discussed with the patient at length the necessity of hospitalization for the possibility of recurrence of diverticulitis, but the patient refused to stay, stating that he had business he needed to take care of this afternoon.  He agreed to return to hospital this  evening for a direct admission.  ROS: General: +5-lb weight loss since last visit 6 days ago, no fevers, chills, changes in appetite Skin: no rash HEENT: no blurry vision, hearing changes, sore throat Pulm: no dyspnea, coughing, wheezing CV: no chest pain, palpitations, shortness of breath Abd: see HPI. Ext: no arthralgias, myalgias Neuro: no weakness, numbness, or tingling  Filed Vitals:   12/15/10 1118  BP: 112/73  Pulse: 68  Temp: 99 F (37.2 C)    PEX General: alert, cooperative, does not appear to be in acute pain HEENT: pupils equal round and reactive to light, vision grossly intact, oropharynx clear and non-erythematous  Neck: supple, no lymphadenopathy, JVD, or carotid bruits Lungs: clear to ascultation bilaterally, normal work of respiration, no wheezes, rales, ronchi Heart: regular rate and rhythm, no murmurs, gallops, or rubs Abdomen: soft, tender to palpation of midline lower abdomen/suprapubic area, non-distended, bowel sounds present, no guarding, no rebound tenderness    Rectal: Prostate firm, non-enlarged, non-tender Msk: no joint edema, warmth, or erythema Extremities: no cyanosis, clubbing, or edema Neurologic: alert & oriented X3, cranial nerves II-XII intact, strength grossly intact, sensation intact to light touch  Assessment/Plan:

## 2010-12-15 NOTE — H&P (Signed)
Hospital Admission Note Date: 12/15/2010  Patient name: Chase Morgan Medical record number: [id number] Date of birth: [birth year] Age: 55 y.o. Gender: male PCP: No primary provider on file.  Medical Service: Internal Medicine Teaching Service  Attending physician:  Dr. Blanch Media    Resident [phone]): Dr. Genella Mech     Pager: [phone] Resident (R1): Dr. Deatra Robinson                 Pager: [phone]  Chief Complaint: Abdominal pain  History of Present Illness: 55 year old male with a recent episode of diverticulitis w/ question of  perirectal abscess and a history of recurrent episodes of prostatitis presents with 3 days of suprapubic pain that radiates into the penis, dysuria and constipation that feels similar to his episode of diverticulitis on his last admission on June 20th. He reports that on Saturday night he had profuse sweats which soaked the sheets and 5 episodes of non-bloody, non-melanotic diarrhea. His last BM was today, although he reports it is 'fragmented', and his last normal BM was prior to this weekend. He completed a 10-day course of cipro/flagyl after discharge July 5th. He denies any fever, chills, nausea, vomiting, or difficulty urinating. Prostate exam in clinic revealed a firm, nonenlarged, nontender prostate. He was admitted directly from the [facility] for presumptive diverticulitis and CT scan.  Current Outpatient Prescriptions on File Prior to Visit  Medication Sig Dispense Refill  . ciprofloxacin (CIPRO) 500 MG tablet Take 500 mg by mouth 2 (two) times daily.        . methylcellulose oral powder Take 1 packet by mouth daily.        . Probiotic Product (PROBIOTIC FORMULA PO) Take 1 tablet by mouth daily.         Allergies: Review of patient's allergies indicates no known allergies.  Past Medical History  Diagnosis Date  . Prostatitis 2007    1-2 episodes/year, episodes typically resolve with short course of cipro   Past Surgical History  Procedure  Date  . Hernia repair 2010    Umbillical hernia repair   Family History  Problem Relation Age of Onset  . Hemochromatosis Mother   . Heart disease Father     s/p coronary bypass around age 22's  . Cancer Father     Colon cancer, roughly in age 57's   History   Social History  . Marital Status: Married    Spouse Name: N/A    Number of Children: N/A  . Years of Education: N/A   Occupational History  . Not on file.   Social History Main Topics  . Smoking status: Current Some Day Smoker -- 0.5 packs/day for 33 years    Types: Cigarettes  . Smokeless tobacco: Not on file  . Alcohol Use: 0.0 oz/week    2-4 Cans of beer per week     DRINKS VERY LITTLE IF ANY  . Drug Use: No  . Sexually Active: Not on file   Other Topics Concern  . Not on file   Social History Narrative  . No narrative on file   Review of Systems: As per HPI. All other systems reviewed and negative  Physical Exam: Vitals: T 99  HR 68  BP 112/73  RR 20 General: Pleasant middle aged man resting in bed HEENT: PERRLA, MMM Cardiac: RRR, no r/m/g Pulm: clear to auscultation bilaterally, not moving large volumes of air Abd: soft, tender to palpation in suprapubic area, mildly tender in bilateral lower quadrants,  BS present Ext: No rashes, no pedal edama, pulses equal bilaterally Neuro: alert and oriented X3, cranial nerves II-XII intact  Lab results: Office Visit on 12/15/2010  Component Date Value Range Status  . aPTT (seconds) 12/15/2010 30  24-37 Final  . Prothrombin Time (seconds) 12/15/2010 14.0  11.6-15.2 Final  . INR  12/15/2010 1.06  0.00-1.49 Final  . Color, Urine  12/15/2010 AMBER* YELLOW Final     . Appearance  12/15/2010 CLOUDY* CLEAR Final  . Specific Gravity, Urine  12/15/2010 1.027  1.005-1.030 Final  . pH  12/15/2010 5.5  5.0-8.0 Final  . Glucose, UA (mg/dL) 16/03/9603 NEG  NEG Final  . Bilirubin Urine  12/15/2010 NEG  NEG Final  . Ketones, ur (mg/dL) 54/01/8118 NEG  NEG Final  . Hgb  urine dipstick  12/15/2010 SMALL* NEG Final  . Protein, ur (mg/dL) 14/78/2956 > 213* NEG Final  . Urobilinogen, UA (mg/dL) 08/65/7846 1  9.6-2.9 Final  . Nitrite  12/15/2010 NEG  NEG Final  . Leukocytes, UA  12/15/2010 NEG  NEG Final  . Sodium (mEq/L) 12/15/2010 138  135-145 Final  . Potassium (mEq/L) 12/15/2010 4.1  3.5-5.1 Final  . Chloride (mEq/L) 12/15/2010 102  96-112 Final  . CO2 (mEq/L) 12/15/2010 26  19-32 Final  . Glucose, Bld (mg/dL) 52/84/1324 93  40-10 Final  . BUN (mg/dL) 27/25/3664 14  4-03 Final  . Creatinine, Ser (mg/dL) 47/42/5956 3.87  5.64-3.32 Final  . Calcium (mg/dL) 95/18/8416 9.1  6.0-63.0 Final  . Total Protein (g/dL) 16/05/930 7.3  3.5-5.7 Final  . Albumin (g/dL) 32/20/2542 3.1* 7.0-6.2 Final  . AST (U/L) 12/15/2010 17  0-37 Final  . ALT (U/L) 12/15/2010 15  0-53 Final  . Alkaline Phosphatase (U/L) 12/15/2010 90  39-117 Final  . Total Bilirubin (mg/dL) 37/62/8315 0.6  1.7-6.1 Final  . GFR calc non Af Amer (mL/min) 12/15/2010 >60  >60 Final  . GFR calc Af Amer (mL/min) 12/15/2010 >60  >60 Final     . WBC (K/uL) 12/15/2010 10.1  4.0-10.5 Final  . RBC (MIL/uL) 12/15/2010 4.25  4.22-5.81 Final  . Hemoglobin (g/dL) 60/73/7106 26.9* 48.5-46.2 Final  . HCT (%) 12/15/2010 36.8* 39.0-52.0 Final  . MCV (fL) 12/15/2010 86.6  78.0-100.0 Final  . MCH (pg) 12/15/2010 29.4  26.0-34.0 Final  . MCHC (g/dL) 70/35/0093 81.8  29.9-37.1 Final  . RDW (%) 12/15/2010 15.1  11.5-15.5 Final  . Platelets (K/uL) 12/15/2010 220  150-400 Final  . Neutrophils Relative (%) 12/15/2010 71  43-77 Final  . Neutro Abs (K/uL) 12/15/2010 7.2  1.7-7.7 Final  . Lymphocytes Relative (%) 12/15/2010 17  12-46 Final  . Lymphs Abs (K/uL) 12/15/2010 1.7  0.7-4.0 Final  . Monocytes Relative (%) 12/15/2010 11  3-12 Final  . Monocytes Absolute (K/uL) 12/15/2010 1.1* 0.1-1.0 Final  . Eosinophils Relative (%) 12/15/2010 1  0-5 Final  . Eosinophils Absolute (K/uL) 12/15/2010 0.1  0.0-0.7 Final  .  Basophils Relative (%) 12/15/2010 0  0-1 Final  . Basophils Absolute (K/uL) 12/15/2010 0.0  0.0-0.1 Final  . Squamous Epithelial / LPF  12/15/2010 FEW  RARE Final  . Crystals  12/15/2010 NONE SEEN  NEG Final  . Casts  12/15/2010 Hyaline casts noted  NEG Final  . WBC, UA (WBC/hpf) 12/15/2010 0-2  <3 Final  . RBC / HPF (RBC/hpf) 12/15/2010 0-2  <3 Final  . Bacteria, UA  12/15/2010 RARE  RARE Final  . Daryll Drown  12/15/2010 MUCOUS NOTED   Final   Imaging results:  CT done 6/25  IMPRESSION:  Subjective on mild decrease in extent of colonic wall thickening  and surrounding inflammatory change, with otherwise stable evidence  of prior diverticulitis. Colonoscopy after resolution of symptoms  is recommended to ensure the absence of underlying neoplasm, which  could produce similar appearance.  Assessment & Plan by Problem:  1) Abdominal pain: Pt is a 55 yo man with recent hospitalization for sigmoid diverticulitis. Considering history and physical exam, his abdominal pain is likely due to recurrent diverticulitis vs. Diverticular abscess. He had a questionable abscess seen on CT scan during last admission. But had clinical improvement and so was sent home with total 10 days of ABx. He likely has abdominal cramps vs 2nd attack of his diverticulitis considering the natural history of uncomplicated Diverticulitis.   Also U/A is negative and so UTI is ruled out. His WBC count is WNL and also he doesn't have any fever.   He will definitely need an Outpt Colonoscopy to check for neoplasm.  - Will admit to regular bed. - Will keep NPO for midnight considering presumable 2nd Diverticulitis. - Start back on Cipro 500 mg po BID and Flagyl 500 mg po TID. - NSl - Pain and nausea meds PRN. - If he spikes fevers and his pain gets worse, will consider repeat CT scan vs. Surgical consult to r/o abscess and consider further surgical approach in terms of drainage.   2) Constipation: He was having  regular BM after DC home on 6/25, until last saturday night when he had 5 BM and then little amount of stool last am. - Will continue on Dulcolex 10 mg suppository BID prn. - Outpatient Colonoscopy. - If he has any more diarrheal episodes, will stop suppository and check C.Diff PCR as has got recent ABx.  3) Recurrent prostatitis: His pain is suprapubic and radiating upto tip of his penis. But his U/A is negative for any infection. - So its likely not Prostatitis attack. - He is followed by Dr. [name], Urology as outpt.

## 2010-12-16 LAB — BASIC METABOLIC PANEL
BUN: 12 mg/dL (ref 6–23)
Chloride: 100 mEq/L (ref 96–112)
Creatinine, Ser: 0.63 mg/dL (ref 0.50–1.35)
GFR calc Af Amer: >60 mL/min (ref >60)
Glucose, Bld: 106 mg/dL — ABNORMAL HIGH (ref 70–99)

## 2010-12-16 LAB — CBC
HCT: 37.1 % — ABNORMAL LOW (ref 39.0–52.0)
Hemoglobin: 12.4 g/dL — ABNORMAL LOW (ref 13.0–17.0)
MCV: 86.5 fL (ref 78.0–100.0)
RDW: 15.2 % (ref 11.5–15.5)
WBC: 9 10*3/uL (ref 4.0–10.5)

## 2010-12-16 LAB — URINE CULTURE: Colony Count: NO GROWTH

## 2010-12-17 ENCOUNTER — Encounter: Payer: Self-pay | Admitting: Internal Medicine

## 2010-12-20 NOTE — Discharge Summary (Signed)
Pt seen in hospital for recurrence of diverticulitis. Please make pt an out-pt appointment for colonoscopy.

## 2010-12-21 LAB — CULTURE, BLOOD (SINGLE)

## 2010-12-22 LAB — CULTURE, BLOOD (ROUTINE X 2)
Culture  Setup Time: 201207180412
Culture: NO GROWTH

## 2010-12-31 NOTE — Discharge Summary (Signed)
NAMEMarland Kitchen  Chase Morgan, Chase Morgan NO.:  [id number]  MEDICAL RECORD NO.:  [id number]  LOCATION:  3004                         FACILITY:  [facility]  PHYSICIAN:  Blanch Media, M.D.DATE OF BIRTH:  [birth year]  DATE OF ADMISSION:  12/15/2010 DATE OF DISCHARGE:  12/16/2010                              DISCHARGE SUMMARY   DISCHARGE DIAGNOSES: 1. Abdominal pain. 2. Constipation. 3. Recurrent prostatitis.  DISCHARGE MEDICATIONS: 1. Acetaminophen 325 mg 1-2 tablets by mouth every 4 hours as needed     for pain. 2. Dulcolax 10 mg rectally twice daily as needed for pain. 3. Ciprofloxacin 500 mg by mouth twice daily, take for 14 days. 4. Flagyl 500 mg by mouth 3 times a day, take for 14 days. 5. Citracal OTC 1 capsule by mouth daily. 6. Probiotic 1 capsule by mouth daily.  DISPOSITION AND FOLLOWUP:  He does have an appointment on January 04, 2011, at our outpatient clinic with Dr. Dorise Hiss at 8:30. Please at this point follow up on getting him an outpatient colonoscopy as well as check for resolution of symptoms or any new symptoms that may have developed.  PROCEDURES PERFORMED:  None.  CONSULTATIONS:  None.  BRIEF ADMITTING HISTORY AND PHYSICAL:  This is a 55 year old guy with a recent episode of diverticulitis with question of perirectal abscess and a history of recurrent episode of prostatitis, who presents with 3 days of suprapubic pain radiating into the penis, dysuria, constipation.  It does feel similar to his past episode of diverticulitis which was on June 20.  He does report on Saturday night he did have profuse sweating which soaked the sheets and 5 episodes of nonbloody and nonmelanotic diarrhea  Last bowel movement was today, although he does reported to be fragmented.  Last normal bowel movement was prior to this weekend.  He did recently completed 10-day course of Cipro, Flagyl after discharge on July 5th.  He does deny any chills, fevers, nausea, vomiting  or difficulty urinating.  Prostate exam in the clinic did reveal a firm nonenlarged, nontender prostate.  He was admitted directly from the outpatient clinic for presumptive diverticulitis and possible CT scan. This pain does occur only really with bending down or sitting up, not occurring at rest.  CURRENT OUTPATIENT MEDICATIONS:  He does have methylcellulose oral powder 1 packet by mouth daily, probiotic 1 tablet by mouth daily, ciprofloxacin 500 mg by mouth  2 times daily.  ALLERGIES:  There are no known allergies.  PAST MEDICAL HISTORY:  Prostatitis.  He does normally take ciprofloxacin for this, it helped in the past, 1-2 episodes per year, did started in 2007 abscesses.  PHYSICAL EXAMINATION:  On the day of admission, VITAL SIGNS:  Temperature was 99, heart rate 68, blood pressure 112/73, respirations 20. GENERAL:  He is a pleasant middle-aged man resting on the bed. HEENT:  His pupils were equal, round, reactive to light and accommodation.  Moist mucous membranes. CARDIAC:  Regular rate and rhythm.  No rubs, murmurs, gallops. PULMONARY:  Clear to auscultation bilaterally, not moving large volumes of air. ABDOMEN:  Soft, tender to palpation in the suprapubic area, mildly tender in bilateral lower quadrant.  Bowel sounds were present.  EXTREMITIES:  Did have no rashes, no pedal edema.  Pulses were equal bilaterally. NEURO:  He was alert and oriented x3.  Cranial nerves II through XII were intact bilaterally.  LABS FROM THIS ADMISSION:  He did have a sodium of 138, a potassium of 4.0, a chloride of 102, a bicarb of 26, BUN of 14 and creatinine of 0.61, with a glucose of 93, a calcium of 9.1.  He did have a total protein of 7.3 and albumin of 3.1, and AST of 17, ALT of 15, an alk phos of 90, total bilirubin of 0.6.  He did have a white blood count of 10.1, a hemoglobin of 12.5 and platelets of 220.  He did have an urinalysis that was done that did show no signs of infection.   He has had a PTT done that was normal at 30 seconds.  He had a PT done that was normal at 14 seconds and an INR done that was normal at 1.06.  He did have an imaging, there was a CT done recently on June 25, the impression was subjective for mild decrease and extensive colonic wall thickening and surrounding inflammatory change, otherwise, stable evidence of prior diverticulitis.  Colonoscopy is recommended after resolution of symptoms and to ensure the absence of underlying neoplasm which could produce similar appearance.  HOSPITAL COURSE BY PROBLEMS: 1. Abdominal pain, so he has a recent hospitalization for a     diverticulitis considering history and physical.  Abdominal pain     likely due to recurrent diverticulitis versus an abscess,     questionable history of abscess on CT but did have clinical     improvement, it was resolving, so likely this is abdominal cramps     versus second attack of diverticulitis.  UA is negative so this     does rule out a possibility of a recurrent UI.  White blood count     is normal.  He does not have any fever.  At this point, he is not     nauseous or vomiting, so he was given some bowel rest and he did     improve in this regard with this pain during this admission.  Would     recommend still outpatient colonoscopy to check for neoplasm.  We     also will start him back on ciprofloxacin 500 mg b.i.d. and Flagyl     500 mg t.i.d. for 2 weeks. We did give him pain and nausea medications as     needed.  He did not spike fevers or the pain did not get worse     during this hospitalization, so we did not obtain CT and we did     feel he is to be stable for discharge in regard to this problem at     this time and feels he was appropriate for outpatient therapy. 2. Constipation.  He was having regular bowel movements and he was     still moving his bowels during this admission, but we did put him     on Dulcolax suppository as needed.  We do recommend  outpatient     colonoscopy.  If he has any more diarrhea, we can stop the     suppository and possibly check a C diff in this future, however,     not indicated at this time and he was stable in regards to this     problem during this hospitalization. 3. Recurrent prostatitis.  Pain  was suprapubic and did radiate to the     tip of his penis, however, UA was negative for any infection,     likely not a prostatitis; however, if he were having an UTI he     would be covered by the ciprofloxacin which he is already on, so     empirically treated even though we do not feel that this is an     issue.  He does follow with Dr. [name] a urology as an     outpatient.  So, if he has any further issues I would recommend him     going back to the urologist.  He was stable in regards for     disposition at this point.  Vitals on the day of discharge, his temperature was 98.2, pulse 64, respirations 19, blood pressure 110/59.  He was satting 98% on room air.  Lab results, he did have a sodium of 136, potassium 4.4, chloride of 100, bicarb of 28, BUN of 12, creatinine of 0.63, glucose of 106, a calcium of 8.9.  He did have a white blood count of 9.0, a hemoglobin of 12.4, and a platelet count of 227.  He did have an urine culture that did show no growth at 1 day and he did have a blood culture that did show no growth at time of discharge.  Both of these will be held at 4 or 5 days and we would recommend checking up on these results at the time of next clinic appointment.  There was no additional imaging done during this admission.  At this point, the patient was stable for discharge.    ______________________________ Genella Mech, MD   ______________________________ Blanch Media, M.D.    EK/MEDQ  D:  12/16/2010  T:  12/17/2010  Job:  [id number]  Electronically Signed by Genella Mech MD on 12/17/2010 11:57:31 AM Electronically Signed by Blanch Media M.D. on 12/31/2010 09:21:49  AM

## 2011-01-04 ENCOUNTER — Ambulatory Visit (INDEPENDENT_AMBULATORY_CARE_PROVIDER_SITE_OTHER): Payer: Self-pay | Admitting: Internal Medicine

## 2011-01-04 ENCOUNTER — Encounter: Payer: Self-pay | Admitting: Internal Medicine

## 2011-01-04 DIAGNOSIS — E669 Obesity, unspecified: Secondary | ICD-10-CM

## 2011-01-04 DIAGNOSIS — Z720 Tobacco use: Secondary | ICD-10-CM | POA: Insufficient documentation

## 2011-01-04 DIAGNOSIS — N400 Enlarged prostate without lower urinary tract symptoms: Secondary | ICD-10-CM

## 2011-01-04 DIAGNOSIS — F17200 Nicotine dependence, unspecified, uncomplicated: Secondary | ICD-10-CM

## 2011-01-04 MED ORDER — DOXAZOSIN MESYLATE 4 MG PO TABS: 4.0000 mg | ORAL_TABLET | Freq: Every day | ORAL | Status: DC

## 2011-01-04 MED ORDER — TAMSULOSIN HCL 0.4 MG PO CAPS: 0.4000 mg | ORAL_CAPSULE | Freq: Every day | ORAL | Status: DC

## 2011-01-04 NOTE — Progress Notes (Signed)
Addended by: Genella Mech A on: 01/04/2011 11:43 AM   Modules accepted: Orders

## 2011-01-04 NOTE — Progress Notes (Signed)
Subjective:    Patient ID: Chase Morgan, male    DOB: 10/12/1955, 55 y.o.   MRN: [id number]  HPI: This is a 55 year old male who comes in today for a hospital followup from an episode of acute diverticulitis. He does have an appointment with [facility] on August 9 to discuss colonoscopy, which is scheduled for August 23. He has had no abdominal pain since leaving the hospital. He did complete his course of ciprofloxacin and metronidazole. He is still having increased frequency of stools. His stools are formed, and are smaller than his typical stools. He is not having a problem with constipation. He is taking a probiotic, as well he is taking calcium plus vitamin D. He has been able to return to work as well as to his activities of daily living. He is fairly active, walking the dog and he does work outside. He is a half a pack a day smoker, and I talked to him about quitting. He is discussing this with his wife at home, and they have obtained a fake cigarette, working this into their routine. I did give him information about smoking cessation today. He has been on Flomax in the past, and has been off for several months. He is having increased frequency of urination at night. He is also having some return of symptoms, and does wish to restart Flomax today. He is up-to-date on all other health maintenance issues    Review of Systems  Constitutional: Negative for fever, chills, activity change and appetite change.  Respiratory: Negative.   Cardiovascular: Negative.   Gastrointestinal: Negative for nausea, vomiting, abdominal pain, diarrhea, constipation, blood in stool and abdominal distention.  Genitourinary: Positive for frequency and difficulty urinating.       Night time frequency  Musculoskeletal: Negative.   Skin: Negative.   Neurological: Negative.   Psychiatric/Behavioral: Negative.    Vitals: Blood pressure 118/75 Temperature 97.5 Fahrenheit Pulse 72 Oxygen saturation 97% on room  air Height 5'11'' Weight 262 pounds    Objective:   Physical Exam  Constitutional: He is oriented to person, place, and time. He appears well-developed and well-nourished. No distress.  HENT:  Head: Normocephalic and atraumatic.  Eyes: EOM are normal. Pupils are equal, round, and reactive to light.  Neck: Normal range of motion. Neck supple.  Cardiovascular: Normal rate, regular rhythm and normal heart sounds.   Pulmonary/Chest: Effort normal and breath sounds normal.  Abdominal: Soft. Bowel sounds are normal. He exhibits no distension and no mass. There is no tenderness. There is no rebound and no guarding.  Lymphadenopathy:    He has no cervical adenopathy.  Neurological: He is alert and oriented to person, place, and time. No cranial nerve deficit.  Skin: Skin is warm and dry.  Psychiatric: He has a normal mood and affect.          Assessment & Plan:  1. followup acute diverticulitis-he is not having any return of symptoms today. He will see GI on the ninth, and have colonoscopy on the 23rd. He is still taking probiotic, and I did advise him to continue this. We will see him again after he has colonoscopy, to discuss findings as well as future plans. No change in medication at this time.  2. BPH-he is having increased frequency of urination at night. He has been on Flomax in the past without adverse effect. I will restart him on Flomax 0.4 mg today and followup on any adverse effects or benefit from Flomax in 6 weeks.  3. Health maintenance-he is up-to-date on all health maintenance, and has had Pneumovax this year. He has also had tetanus within the last year or so. I did advise him to get a flu shot yearly, and he will get colonoscopy on the 23rd.  4.  tobacco abuse- he does use about a half pack per day currently, and I did discuss cessation with him. He is amenable to quitting, and will continue to talk this over with his wife. I did give him information on cessation today and  advised him that we will revisit this issue at our next visit in 6 weeks.   5. Obesity-I did talk to him about diet modification, and exercise. I did give him information about diet today, and he will try to limit his portion sizes. BMI today 36.54.

## 2011-01-04 NOTE — Progress Notes (Signed)
Dr Dorise Hiss and I saw MR Frick when he was hospitalized last. He is now back for F/U. I discussed Mr Poupard with Dr Christain [place] and agree with her note.

## 2011-01-04 NOTE — Patient Instructions (Addendum)
You were seen today for a hospital follow up. You are going to [facility] to have a colonsocopy and will see them on the 9th and get your colonoscopy on the 23rd of August. We are adding Flomax 0.4 mg daily today, and you are finished with your course of antibiotics. We will see you back in the office in 1-2 months.  If you have any return of symptoms please feel free to call our office so that you can be seen sooner. Our number is [phone]. Please continue to work on stopping smoking and controlling your portion size.  Colonoscopy A colonoscopy is an exam to evaluate your entire colon. In this exam, your colon is cleansed. A long fiberoptic tube is inserted through your rectum and into your colon. The fiberoptic scope (endoscope) is a long bundle of enclosed and very flexible fibers. These fibers transmit light to the area examined and send images from that area to your caregiver. Discomfort is usually minimal. You may be given a drug to help you sleep (sedative) during or prior to the procedure. This exam helps to detect lumps (tumors), polyps, inflammation, and areas of bleeding. Your caregiver may also take a small piece of tissue (biopsy) that will be examined under a microscope. BEFORE THE PROCEDURE  A clear liquid diet may be required for 2 days before the exam.   Liquid injections (enemas) or laxatives may be required.   A large amount of electrolyte solution may be given to you to drink over a short period of time. This solution is used to clean out your colon.   You should be present as instructed prior to your procedure or as directed by your caregiver.   Check in at the admissions desk to fill out necessary forms if not preregistered. There will be consent forms to sign prior to the procedure. If accompanied by friends or family, there is a waiting area for them while you are having your procedure.  LET YOUR CAREGIVER KNOW ABOUT:  Allergies to food or medicine.  Medicines taken,  including vitamins, herbs, eyedrops, over-the-counter medicines, and creams.   Use of steroids (by mouth or creams).   Previous problems with anesthetics or numbing medicines.   History of bleeding problems or blood clots.  Previous surgery.   Other health problems, including diabetes and kidney problems.   Possibility of pregnancy, if this applies.   AFTER THE PROCEDURE  If you received a sedative and/or pain medicine, you will need to arrange for someone to drive you home.   Occasionally, there is a little blood passed with the first bowel movement. DO NOT be concerned.  HOME CARE INSTRUCTIONS  It is not unusual to pass moderate amounts of gas and experience mild abdominal cramping following the procedure. This is due to air being used to inflate your colon during the exam. Walking or a warm pack on your belly (abdomen) may help.   You may resume all normal meals and activities after sedatives and medicines have worn off.   Only take over-the-counter or prescription medicines for pain, discomfort, or fever as directed by your caregiver. DO NOT use aspirin or blood thinners if a biopsy was taken. Consult your caregiver for medicine usage if biopsies were taken.  FINDING OUT THE RESULTS OF YOUR TEST Not all test results are available during your visit. If your test results are not back during the visit, make an appointment with your caregiver to find out the results. Do not assume everything is normal  if you have not heard from your caregiver or the medical facility. It is important for you to follow up on all of your test results. SEEK IMMEDIATE MEDICAL CARE IF:  You have an oral temperature above 103F, not controlled by medicine.   You pass large blood clots or fill a toilet with blood following the procedure. This may also occur 10 to 14 days following the procedure. This is more likely if a biopsy was taken.   You develop abdominal pain that keeps getting worse and cannot be  relieved with medicine.  Document Released: 05/14/2000 Document Re-Released: 08/11/2009 [facility] Patient Information 2011 [place], [place].  Smoking Cessation  1 800 QUIT NOW has resources that they can use to assist you to quit including: counseling, smoking cessation aids (patch, gum, etc), and support. This document explains the best ways for you to quit smoking and new treatments to help. It lists new medicines that can double or triple your chances of quitting and quitting for good. It also considers ways to avoid relapses and concerns you may have about quitting, including weight gain. NICOTINE: A POWERFUL ADDICTION If you have tried to quit smoking, you know how hard it can be. It is hard because nicotine is a very addictive drug. For some people, it can be as addictive as heroin or cocaine. Usually, people make 2 or 3 tries, or more, before finally being able to quit. Each time you try to quit, you can learn about what helps and what hurts. Quitting takes hard work and a lot of effort, but you can quit smoking. QUITTING SMOKING IS ONE OF THE MOST IMPORTANT THINGS YOU WILL EVER DO:  You will live longer, feel better, and live better.   The impact on your body of quitting smoking is felt almost immediately:   Within 20 minutes, blood pressure decreases. Pulse returns to its normal level.   After 8 hours, carbon monoxide levels in the blood return to normal. Oxygen level increases.   After 24 hours, chance of heart attack starts to decrease. Breath, hair, and body stop smelling like smoke.   After 48 hours, damaged nerve endings begin to recover. Sense of taste and smell improve.   After 72 hours, the body is virtually free of nicotine. Bronchial tubes relax and breathing becomes easier.   After 2 to 12 weeks, lungs can hold more air. Exercise becomes easier and circulation improves.   Quitting will lower your chance of having a heart attack, stroke, cancer, or lung disease:   After  1 year, the risk of coronary heart disease is cut in half.   After 5 years, the risk of stroke falls to the same as a nonsmoker.   After 10 years, the risk of lung cancer is cut in half and the risk of other cancers decreases significantly.   After 15 years, the risk of coronary heart disease drops, usually to the level of a nonsmoker.   If you are pregnant, quitting smoking will improve your chances of having a healthy baby.   The people you live with, especially your children, will be healthier.   You will have extra money to spend on things other than cigarettes.  FIVE KEYS TO QUITTING Studies have shown that these 5 steps will help you quit smoking and quit for good. You have the best chances of quitting if you use them together: 1. Get ready.  2. Get support and encouragement.  3. Learn new skills and behaviors.  4. Get  medicine to reduce your nicotine addiction and use it correctly.  5. Be prepared for relapse or difficult situations. Be determined to continue trying to quit, even if you do not succeed at first.  1. GET READY  Set a quit date.   Change your environment.   Get rid of ALL cigarettes, ashtrays, matches, and lighters in your home, car, and place of work.   Do not let people smoke in your home.   Review your past attempts to quit. Think about what worked and what did not.   Once you quit, do not smoke. NOT EVEN A PUFF!  2. GET SUPPORT AND ENCOURAGEMENT Studies have shown that you have a better chance of being successful if you have help. You can get support in many ways.  Tell your family, friends, and coworkers that you are going to quit and need their support. Ask them not to smoke around you.   Talk to your caregivers (doctor, dentist, nurse, pharmacist, psychologist, and/or smoking counselor).   Get individual, group, or telephone counseling and support. The more counseling you have, the better your chances are of quitting. Programs are available at [employer] and health centers. Call your local health department for information about programs in your area.   Spiritual beliefs and practices may help some smokers quit.   Quit meters are Photographer that keep track of quit statistics, such as amount of "quit-time," cigarettes not smoked, and money saved.   Many smokers find one or more of the many self-help books available useful in helping them quit and stay off tobacco.  3. LEARN NEW SKILLS AND BEHAVIORS  Try to distract yourself from urges to smoke. Talk to someone, go for a walk, or occupy your time with a task.   When you first try to quit, change your routine. Take a different route to work. Drink tea instead of coffee. Eat breakfast in a different place.   Do something to reduce your stress. Take a hot bath, exercise, or read a book.   Plan something enjoyable to do every day. Reward yourself for not smoking.   Explore interactive web-based programs that specialize in helping you quit.  4. GET MEDICINE AND USE IT CORRECTLY Medicines can help you stop smoking and decrease the urge to smoke. Combining medicine with the above behavioral methods and support can quadruple your chances of successfully quitting smoking. The [place] Food and Drug Administration (FDA) has approved 7 medicines to help you quit smoking. These medicines fall into 3 categories.  Nicotine replacement therapy (delivers nicotine to your body without the negative effects and risks of smoking):   Nicotine gum: Available over-the-counter.   Nicotine lozenges: Available over-the-counter.   Nicotine inhaler: Available by prescription.   Nicotine nasal spray: Available by prescription.   Nicotine skin patches (transdermal): Available by prescription and over-the-counter.   Antidepressant medicine (helps people abstain from smoking, but how this works is unknown):   Bupropion sustained-release (SR) tablets: Available by  prescription.   Nicotinic receptor partial agonist (simulates the effect of nicotine in your brain):   Varenicline tartrate tablets: Available by prescription.   Ask your caregiver for advice about which medicines to use and how to use them. Carefully read the information on the package.   Everyone who is trying to quit may benefit from using a medicine. If you are pregnant or trying to become pregnant, nursing an infant, you are under age 34, or you smoke fewer than  10 cigarettes per day, talk to your caregiver before taking any nicotine replacement medicines.   You should stop using a nicotine replacement product and call your caregiver if you experience nausea, dizziness, weakness, vomiting, fast or irregular heartbeat, mouth problems with the lozenge or gum, or redness or swelling of the skin around the patch that does not go away.   Do not use any other product containing nicotine while using a nicotine replacement product.   Talk to your caregiver before using these products if you have diabetes, heart disease, asthma, stomach ulcers, you had a recent heart attack, you have high blood pressure that is not controlled with medicine, a history of irregular heartbeat, or you have been prescribed medicine to help you quit smoking.  5. BE PREPARED FOR RELAPSE OR DIFFICULT SITUATIONS  Most relapses occur within the first 3 months after quitting. Do not be discouraged if you start smoking again. Remember, most people try several times before they finally quit.   You may have symptoms of withdrawal because your body is used to nicotine. You may crave cigarettes, be irritable, feel very hungry, cough often, get headaches, or have difficulty concentrating.   The withdrawal symptoms are only temporary. They are strongest when you first quit, but they will go away within 10 to 14 days.  Here are some difficult situations to watch for:  Alcohol. Avoid drinking alcohol. Drinking lowers your chances of  successfully quitting.   Caffeine. Try to reduce the amount of caffeine you consume. It also lowers your chances of successfully quitting.   Other smokers. Being around smoking can make you want to smoke. Avoid smokers.   Weight gain. Many smokers will gain weight when they quit, usually less than 10 pounds. Eat a healthy diet and stay active. Do not let weight gain distract you from your main goal, quitting smoking. Some medicines that help you quit smoking may also help delay weight gain. You can always lose the weight gained after you quit.   Bad mood or depression. There are a lot of ways to improve your mood other than smoking.  If you are having problems with any of these situations, talk to your caregiver. SPECIAL SITUATIONS OR CONDITIONS Studies suggest that everyone can quit smoking. Your situation or condition can give you a special reason to quit.  Pregnant women/New mothers: By quitting, you protect your baby's health and your own.   Hospitalized patients: By quitting, you reduce health problems and help healing.   Heart attack patients: By quitting, you reduce your risk of a second heart attack.   Lung, head, and neck cancer patients: By quitting, you reduce your chance of a second cancer.   Parents of children and adolescents: By quitting, you protect your children from illnesses caused by secondhand smoke.  QUESTIONS TO THINK ABOUT Think about the following questions before you try to stop smoking. You may want to talk about your answers with your caregiver.  Why do you want to quit?   If you tried to quit in the past, what helped and what did not?   What will be the most difficult situations for you after you quit? How will you plan to handle them?   Who can help you through the tough times? Your family? Friends? Caregiver?   What pleasures do you get from smoking? What ways can you still get pleasure if you quit?  Here are some questions to ask your  caregiver:  How can you help me to  be successful at quitting?   What medicine do you think would be best for me and how should I take it?   What should I do if I need more help?   What is smoking withdrawal like? How can I get information on withdrawal?  Quitting takes hard work and a lot of effort, but you can quit smoking. FOR MORE INFORMATION Smokefree.gov (http://www.davis-sullivan.com/) provides free, accurate, evidence-based information and professional assistance to help support the immediate and long-term needs of people trying to quit smoking. Document Released: 05/11/2001 Document Re-Released: 11/04/2009 [employer] Patient Information 2011 [employer]72.  Calorie Counting Diet A calorie counting diet requires you to eat the number of calories that are right for you during a day. Calories are the measurement of how much energy you get from the food you eat. Eating the right amount of calories is important for staying at a healthy weight. If you eat too many calories your body will store them as fat and you may gain weight. If you eat too few calories you may lose weight. Counting the number of calories that you eat during a day will help you to know if you're eating the right amount. A Registered Dietitian can determine how many calories you need in a day. The amount of calories you need varies from person to person. If your goal is to lose weight you will need to eat fewer calories. Losing weight can benefit you if you are overweight or have health problems such as heart disease, high blood pressure or diabetes. If your goal is to gain weight, you will need to eat more calories. Gaining weight may be necessary if you have a certain health problem that causes your body to need more energy. TIPS Whether you are increasing or decreasing the number of calories you eat during a day, it may be hard to get used to changing what you eat and drink. The following are tips to help you keep track of the  number of calories you are eating.  Measuring foods at home with measuring cups will help you to know the actual amount of food and number of calories you are eating.   Restaurants serve food in all different portion sizes. It is common that restaurants will serve food in amounts worth 2 or more serving sizes. While eating out, it may be helpful to estimate how many servings of a food you are given. For example, a serving of cooked rice is 1/2 cup and that is the size of half of a fist. Knowing serving sizes will help you have a better idea of how much food you are eating at restaurants.   Ask for smaller portion sizes or child-size portions at restaurants.   Plan to eat half of a meal at a restaurant and take the rest home or share the other half with a friend   Read food labels for calorie content and serving size   Most packaged food has a Nutrition Facts Panel on its side or back. Here you can find out how many servings are in a package, the size of a serving, and the number of calories each serving has.   The serving size and number of servings per container are listed right below the Nutrition Facts heading. Just below the serving information, the number of calories in each serving is listed.   For example, say that a package has three cookies inside. The Nutrition Facts panel says that one serving is one cookie. Below that,  it says that there are three servings in the container. The calories section of the Nutrition Facts says there are 90 calories. That means that there are 90 calories in one cookie. If you eat one cookie you have eaten 90 calories. If you eat all three cookies, you have eaten three times that amount, or 270 calories.  The list below tells you how big or small some common portion sizes are.  1 ounce (oz).................4 stacked dice.   3 oz.............................Marland KitchenDeck of cards.   1 teaspoon (tsp)..........Marland KitchenTip of little finger.   1 tablespoon (Tbsp).Marland KitchenMarland KitchenMarland KitchenTip of  thumb.   2 Tbsp.........................Marland KitchenGolf ball.    Cup.........................Marland KitchenHalf of a fist.   1 Cup..........................Marland KitchenA fist.  KEEP A FOOD LOG Write down every food item that you eat, how much of the food you eat, and the number of calories in each food that you eat during the day. At the end of the day or throughout the day you can add up the total number of calories you have eaten.  It may help to set up a list like the one below. Find out the calorie information by reading food labels.  Breakfast   Bran Flakes (1 cup, 110 calories).   Fat free milk ( cup, 45 calories).   Snack   Apple (1 medium, 80 calories).   Lunch   Spinach (1 cup, 20 calories).   Tomato ( medium, 20 calories).   Chicken breast strips (3 oz, 165 calories).   Shredded cheddar cheese ( cup, 110 calories).   Light [place] dressing (2 Tbsp, 60 calories).   Whole wheat bread (1 slice, 80 calories).   Tub margarine (1 tsp, 35 calories).   Vegetable soup (1 cup, 160 calories).   Dinner   Pork chop (3 oz, 190 calories).   Brown rice (1 cup, 215 calories).   Steamed broccoli ( cup, 20 calories).   Strawberries (1  cup, 65 calories).   Whipped cream (1 Tbsp, 50 calories).  Daily Calorie Total: 1425 Information from www.eatright.org, Foodwise Nutritional Analysis Database. Document Released: 05/17/2005 Document Re-Released: 06/08/2009 [facility] Patient Information 2011 [place], [place].

## 2011-01-07 ENCOUNTER — Encounter: Payer: Self-pay | Admitting: Internal Medicine

## 2011-01-08 ENCOUNTER — Ambulatory Visit (AMBULATORY_SURGERY_CENTER): Payer: Self-pay | Admitting: *Deleted

## 2011-01-08 VITALS — Ht 71.0 in | Wt 263.3 lb

## 2011-01-08 DIAGNOSIS — Z1211 Encounter for screening for malignant neoplasm of colon: Secondary | ICD-10-CM

## 2011-01-08 MED ORDER — PEG-KCL-NACL-NASULF-NA ASC-C 100 G PO SOLR: ORAL | Status: DC

## 2011-01-12 NOTE — Discharge Summary (Signed)
NAMEMarland Kitchen  Chase Morgan, Chase NO.:  [id number]  MEDICAL RECORD NO.:  [id number]  LOCATION:  3002                         FACILITY:  [facility]  PHYSICIAN:  Chase Morgan, M.D.  DATE OF BIRTH:  [birth year]  DATE OF ADMISSION:  11/18/2010 DATE OF DISCHARGE:  11/23/2010                              DISCHARGE SUMMARY   ATTENDING PHYSICIAN:  Chase Roup, MD  DISCHARGE DIAGNOSES: 1. Acute diverticulitis. 2. Constipation. 3. Recurrent prostatitis. 4. Umbilical hernia repair in 2009.  DISCHARGE MEDS WITH ACCURATE DOSES: 1. Bisacodyl 10 mg suppository, insert 10 mg rectally twice daily as     needed. 2. Cipro 500 mg take 1 tablet by mouth twice daily for 10 days. 3. Flagyl 500 mg, take 1 tablet by mouth twice daily for 10 days. 4. Citrucel, take 1 tablespoon by mouth daily. 5. Probiotic, take 1 tablet by mouth daily.  DISPOSITION AND FOLLOWUP:  The patient was discharged home from [facility] in stable and improved condition on November 23, 2010.  The patient has been scheduled a follow up appointment with [facility] with Dr. Manson Passey on December 09, 2010 at 1:30 p.m.  At that visit, 1. His CBC needs to be checked to make sure there is no elevation in     the white count. 2. The patient needs a referral for colonoscopy,that needs to be     scheduled in 4 weeks from his clinic appointment at the followup on     diverticulitis.  PROCEDURES PERFORMED: 1. CT abdomen on November 18, 2010 which showed findings consistent with     severe sigmoid diverticulitis with focal colitis.  There is about     5.3 cm perirectal fluid collection which may reflect an abscess.     Mild left-sided ureterectasis with delayed contrast excretion      consistent with partial  urethral obstruction secondary to left      lower quadrant inflammatory process.  No evidence of appendicitis     or bowel obstruction. 2. Repeat CT scan on November 22, 2009 which showed subjective or mild  decrease in extent of colonic wall thickening and surrounding     inflammatory change with otherwise stable evidence of prior     diverticulitis.  Colonoscopy after resolution of symptoms is     recommended.  CONSULTATIONS:  Surgery for acute diverticulitis.  ADMITTING HISTORY AND PHYSICAL/HISTORY OF PRESENT ILLNESS:  A 55 year old man with no significant past medical history except for recurrent prostatitis presented to ER with concern for progressively worsening lower quadrant abdominal pain.  The patient describes his pain is persistent, uncomfortable specifically in suprapubic area with radiation to left and right lower quadrant anywhere from 5-10/10 approximately 3 weeks in duration associated with fever and chills, diaphoresis and nausea.  The patient reports seeing a urologist who initially felt that this was another prostatitis episode.  His urologist prescribed him ciprofloxacin and the patient reports being compliant with medication but his pain persisted.  He reports continuous dysuria, hematuria last week, urinary dribbling and feeling of incomplete voiding.  In addition, he reports severe constipation over a period of 1 month.  Over-the- counter medication provided  no benefit.  The patient denies similar episodes of this type of pain in the past and even recurrent prostatitis.  He denies any recent sickness or hospitalizations, no episodes of chest pain, shortness of breath, no blood in stools.  The patient denies any systemic symptoms of weight loss or change in appetite.  PHYSICAL EXAMINATION:  VITALS:  Temperature 98.3, blood pressure 123/85, heart rate 72, respiratory rate 16, O2 sats 98% on room air. GENERAL:  Alert, oriented, well developed and well nourished. HEENT:  Normocephalic, atraumatic, mucous membranes moist and pink. CVS:  Regular rate and rhythm, S1, S2 normal.  No murmurs, rubs, or gallops. CHEST:  Clear to auscultation bilaterally.  No wheezes, rales,  or rhonchi. ABDOMEN:  Soft, slightly distended, no tenderness in lower quadrants bilaterally and worsened suprapubic region, soft, bowel sounds.  No organomegaly. MUSCULOSKELETAL:  No joint deformities. NEUROLOGIC:  Alert and oriented x3.  Cranial nerves II-XII intact, motor and sensation intact.  LABS ON ADMISSION:  White cell count 12.4, hemoglobin 12.2, hematocrit 35.6, platelets 275,000.  Sodium 137, potassium 3.9, chloride 104, bicarb 24, glucose 98, BUN 15, creatinine 1.  UA was clear.  HOSPITAL COURSE BY PROBLEM: 1. Acute diverticulitis.  The patient presented with complaints of     abdominal pain and subjective fever.  He had CT abdomen which     confirmed sigmoid diverticulitis and showed the perirectal fluid collection thought to possibly reflect an abscess.  The patient was started on IV Zosyn.  Surgery consult was obtained, and they advised an IR consult for drainange; IR was consulted,  but felt that the fluid could not be safely approached with radiologic guidance, and also felt that the fluid represented free fluid rather than an abscess.  The consensus was that IV antibiotic therapy with follow-up CT was the best option.  The patient  was continued on Zosyn.  His white count continued to improve throughout his     hospitalization.  The patient was initially started on a clear     liquid diet which was slowly advanced and the patient tolerated     that well.  The patient underwent a repeat     CT scan after 5 days of IV Zosyn which showed some subjective or     mild decrease in the inflammatory changes as compared to a prior CT     scan.  The patient was discharged home on 10 days of Cipro and     Flagyl with a followup scheduled at [facility].     The patient needs to be referred to a colonoscopy in 4 weeks from     his clinic visit as a followup on diverticulitis and to rule out     other possible diagnostic considerations like colon cancer. 2.  Recurrent prostatitis.  The patient has history of recurrent     prostatitis for which he follows up with Dr. [name] at [facility]     [facility].  He was discussed with Dr. [name] who did not     think this episode was related to his prostatitis and therefore,     the patient does not need any urological intervention at this time. 3. Constipation.  The patient complained of constipation throughout     his hospitalization.  He was given Dulcolax suppository to which he     responded.  He was discharged home on the same bowel regimen.  DISCHARGE LABS AND VITALS:  His discharge vitals include temperature 97.8, pulse  58, respiratory rate 19, blood pressure 113/73, O2 sats 96% on room air.  His discharge labs include white cell count 6.9, hemoglobin 11.6, hematocrit 35.3, platelets of 326,000.  Sodium 140, potassium 4, chloride 106, bicarb 27, glucose 83, BUN 8, creatinine 0.82, calcium 9.    ______________________________ Chase Jarvis, MD   ______________________________ Chase Morgan, M.D.    MS/MEDQ  D:  11/25/2010  T:  11/26/2010  Job:  [id number]  cc:   [facility]  Electronically Signed by Chase Jarvis MD on 12/13/2010 03:16:37 PM Electronically Signed by Margarito Liner M.D. on 01/12/2011 06:38:05 PM

## 2011-01-20 ENCOUNTER — Telehealth: Payer: Self-pay | Admitting: Internal Medicine

## 2011-01-20 NOTE — Telephone Encounter (Signed)
Writer spoke with Adaline Sill, who states pt should just drink clear liquids t/o day today and keep procedure as scheduled.  Writer spoke with pt and above information given.  Pt voided understanding and will drink clear liquids t/o day

## 2011-01-21 ENCOUNTER — Encounter: Payer: Self-pay | Admitting: Internal Medicine

## 2011-01-21 ENCOUNTER — Ambulatory Visit (AMBULATORY_SURGERY_CENTER): Payer: Self-pay | Admitting: Internal Medicine

## 2011-01-21 ENCOUNTER — Telehealth: Payer: Self-pay | Admitting: *Deleted

## 2011-01-21 DIAGNOSIS — K5792 Diverticulitis of intestine, part unspecified, without perforation or abscess without bleeding: Secondary | ICD-10-CM

## 2011-01-21 DIAGNOSIS — K5732 Diverticulitis of large intestine without perforation or abscess without bleeding: Secondary | ICD-10-CM

## 2011-01-21 DIAGNOSIS — Z1211 Encounter for screening for malignant neoplasm of colon: Secondary | ICD-10-CM

## 2011-01-21 DIAGNOSIS — K56609 Unspecified intestinal obstruction, unspecified as to partial versus complete obstruction: Secondary | ICD-10-CM

## 2011-01-21 MED ORDER — SODIUM CHLORIDE 0.9 % IV SOLN: 500.0000 mL | INTRAVENOUS | Status: DC

## 2011-01-21 NOTE — Patient Instructions (Signed)
Discharge instructions reviewed with patient and care partner.  Impressions/recommendations:  Diverticulitis (handout given)  Repeat exam in 5 years.  CT Scan in 3-4 weeks, office will call you with an appointment.  Recommend surgical consult for resection after inflammation.  Continue medications as you were taking them prior to your procedure.

## 2011-01-22 ENCOUNTER — Telehealth: Payer: Self-pay | Admitting: *Deleted

## 2011-01-22 NOTE — Telephone Encounter (Signed)
Scheduled patient for CT abd, pelvis on 02/09/11 at 1:00 PM at [facility] CT as per procedure note on 01/21/11. Contrast 2 and 1 hour prior. NPO 4 hours prior.

## 2011-01-22 NOTE — Telephone Encounter (Signed)
Follow up Call- Patient questions:  Do you have a fever, pain , or abdominal swelling? yes Pain Score  2 *  Have you tolerated food without any problems? no  Have you been able to return to your normal activities? no  Do you have any questions about your discharge instructions: Diet   no Medications  no Follow up visit  no  Do you have questions or concerns about your Care? yes  Actions: * If pain score is 4 or above:"No action needed, pain <4."}  Patient states he had abd. Cramping and bloating when he got home and vomit during the night. He said he did pass out good amt of air x2 about 7 am today and now he feels much better. He states he still having diarrhea during the night and he was unable to eat or sleep due to the cramps. Now patient states he only had "little discomfort". Explained to patient to try to rest today and try a lite bland food today & to drink liquids. Also explained to patient if he feels like he is not getting better today to call us back before 5pm and i will contact his doctor. He understands. He states he seems much better now and he was going to rest. Patient denies abd. Tightness & fever.

## 2011-01-22 NOTE — Telephone Encounter (Signed)
Patient given date and instructions for CT. Contrast up front for pick up.

## 2011-02-09 ENCOUNTER — Ambulatory Visit (INDEPENDENT_AMBULATORY_CARE_PROVIDER_SITE_OTHER)
Admission: RE | Admit: 2011-02-09 | Discharge: 2011-02-09 | Disposition: A | Discharge status: Home / Self Care | Payer: Self-pay | Source: Ambulatory Visit | Attending: Internal Medicine | Admitting: Internal Medicine

## 2011-02-09 DIAGNOSIS — K5792 Diverticulitis of intestine, part unspecified, without perforation or abscess without bleeding: Secondary | ICD-10-CM

## 2011-02-09 DIAGNOSIS — K5732 Diverticulitis of large intestine without perforation or abscess without bleeding: Secondary | ICD-10-CM

## 2011-02-09 MED ORDER — IOHEXOL 300 MG/ML  SOLN
100.0000 mL | Freq: Once | INTRAMUSCULAR | Status: AC | PRN
  Administered 2011-02-09: 100 mL via INTRAVENOUS

## 2011-02-10 ENCOUNTER — Telehealth: Payer: Self-pay | Admitting: *Deleted

## 2011-02-10 MED ORDER — AMOXICILLIN-POT CLAVULANATE 875-125 MG PO TABS: ORAL_TABLET | ORAL | Status: DC

## 2011-02-10 NOTE — Telephone Encounter (Signed)
Spoke with patient and gave him the results and recommendations. Rx sent to pharmacy. Patient scheduled on 02/26/11 at 2:00 PM.

## 2011-02-10 NOTE — Telephone Encounter (Signed)
Message copied by Daphine Deutscher on Wed Feb 10, 2011  8:43 AM ------      Message from: Hart Carwin      Created: Tue Feb 09, 2011  9:54 PM       Please call pt with the result of the CT scan, diverticulitis much improved but still some edema persist. I am in favor of putting him on more antibiotic Augmentin 875 mg, #14, 1 po qd. F/up appointment first available

## 2011-02-26 ENCOUNTER — Ambulatory Visit (INDEPENDENT_AMBULATORY_CARE_PROVIDER_SITE_OTHER): Payer: Self-pay | Admitting: Internal Medicine

## 2011-02-26 ENCOUNTER — Encounter: Payer: Self-pay | Admitting: Internal Medicine

## 2011-02-26 VITALS — BP 118/78 | HR 78 | Ht 71.0 in

## 2011-02-26 DIAGNOSIS — R933 Abnormal findings on diagnostic imaging of other parts of digestive tract: Secondary | ICD-10-CM

## 2011-02-26 DIAGNOSIS — K5732 Diverticulitis of large intestine without perforation or abscess without bleeding: Secondary | ICD-10-CM

## 2011-02-26 MED ORDER — CEPHALEXIN 250 MG PO CAPS: 250.0000 mg | ORAL_CAPSULE | Freq: Four times a day (QID) | ORAL | Status: AC

## 2011-02-26 NOTE — Patient Instructions (Addendum)
You have been given a separate informational sheet regarding your tobacco use, the importance of quitting and local resources to help you quit. We have sent the following medications to your pharmacy for you to pick up at your convenience: Keflex 1 tablet four times daily x 14 days.  Dr Philis Nettle. [employer]

## 2011-02-26 NOTE — Progress Notes (Signed)
Chase Morgan [birth year] MRN [id number]     History of Present Illness:  This is a 55 year old white male with a history of a recent hospitalization for diverticular abscess. Patient is status post percutaneous drainage and a prolonged antibiotic course. A followup CT scan of the abdomen on February 10, 2011 showed marked improvement in the inflammatory process in the left lower quadrant and a persistent linear soft tissue density in the left sigmoid colon suspicious for fistula. A colonoscopy in August of this year confirmed inflammatory changes of the sigmoid colon with edema and partial obstruction of the sigmoid colon due to inflammation. He has just completed another course of Augmentin. He has been on a low-residue diet. He denies abdominal pain, fever or pneumaturia.   Past Medical History  Diagnosis Date  . Prostatitis 2007    1-2 episodes/year, episodes typically resolve with short course of cipro  . Diverticulitis 2012    2 episodes in June-July 2012, will get colonoscopy from GI in Aug 2012  . Obesity   . Tobacco abuse   . BPH (benign prostatic hyperplasia)    Past Surgical History  Procedure Date  . Umbilical hernia repair 2010    reports that he has been smoking Cigarettes.  He has a 16.5 pack-year smoking history. He has never used smokeless tobacco. He reports that he drinks alcohol. He reports that he does not use illicit drugs. family history includes Cancer in his father; Colon cancer in his father; Heart disease in his father; and Hemochromatosis in his mother. No Known Allergies      Review of Systems: Negative for fever chills or abdominal pain. Positive for constipation  The remainder of the 10 point ROS is negative except as outlined in H&P   Physical Exam: General appearance  Well developed, in no distress. Eyes- non icteric. HEENT nontraumatic, normocephalic. Mouth no lesions, tongue papillated, no cheilosis. Neck supple without adenopathy, thyroid  not enlarged, no carotid bruits, no JVD. Lungs Clear to auscultation bilaterally. Cor normal S1, normal S2, regular rhythm, no murmur,  quiet precordium. Abdomen: Obese soft abdomen, nontender. Normal active bowel sounds. No palpable mass, no rebound. Rectal: Not done. Extremities no pedal edema. Skin no lesions. Neurological alert and oriented x 3. Psychological normal mood and affect.  Assessment and Plan:  Problem # 1 This is diverticular abscess clinically improved on a prolonged course of antibiotics. He has a chronically diseased  sigmoid colon which is partially obstructed and inflamed. He is currently trying to get another job with benefits. He would prefer to wait to have surgery on his colon. We have discussed laparoscopically assisted sigmoid resection. He would prefer to get another course of antibiotics and stay on a limited residue diet. He would let us know if the symptoms recur. He is aware of the fact that another recurrence of diverticular abscess may result in a colostomy. We will call in Keflex 250 mg 4 times a day for 14 days.   02/26/2011 Chase Morgan

## 2011-03-03 LAB — URINALYSIS, ROUTINE W REFLEX MICROSCOPIC
Glucose, UA: NEGATIVE
Nitrite: NEGATIVE
Specific Gravity, Urine: 1.01
pH: 5.5

## 2011-03-03 LAB — DIFFERENTIAL
Basophils Absolute: 0
Eosinophils Relative: 1
Lymphocytes Relative: 31
Lymphs Abs: 2.4
Neutro Abs: 4.5

## 2011-03-03 LAB — CREATININE, SERUM
Creatinine, Ser: 0.8
GFR calc non Af Amer: >60

## 2011-03-03 LAB — BUN: BUN: 11

## 2011-03-03 LAB — CBC
HCT: 48.2
Hemoglobin: 16.2
RBC: 5.31
RDW: 14.2

## 2012-01-01 IMAGING — CT CT ABD-PELV W/ CM
2 of 5 series · 14 of 32 positions shown, 19 images · IV contrast (water & 80ml omni 300)
Comparison: None.

CLINICAL DATA: Abdominal pain and constipation.  Recent treatment
for prostate infection.  The patient reports painful urination and
back pain.

CT ABDOMEN AND PELVIS WITH CONTRAST
TECHNIQUE: Multidetector CT imaging of the abdomen and pelvis was
performed following the standard protocol during bolus
administration of intravenous contrast.
Contrast: 80 ml Cmnipaque-988 intravenously.

[Series 2: routine abdomen · axial · 0.93mm/px · z∈[-462,-112]mm · 6 of 99 slices shown, 11 images]
[im 15/99  soft-tissue]
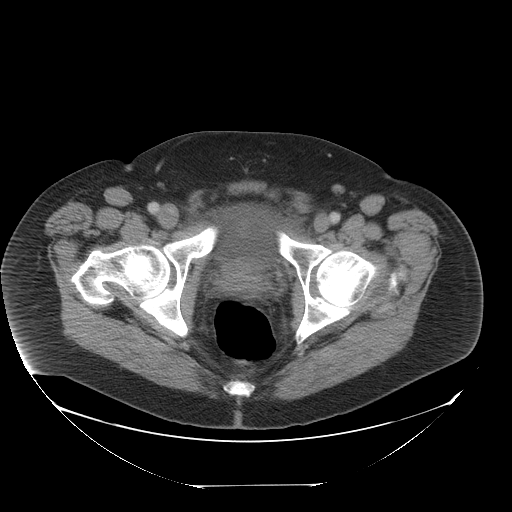
[im 15/99  bone]
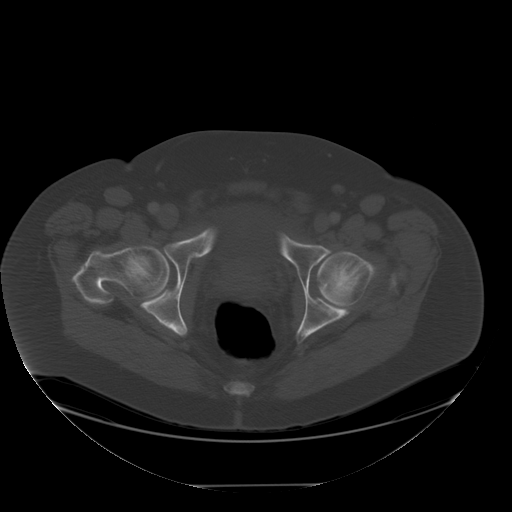
[im 29/99  soft-tissue]
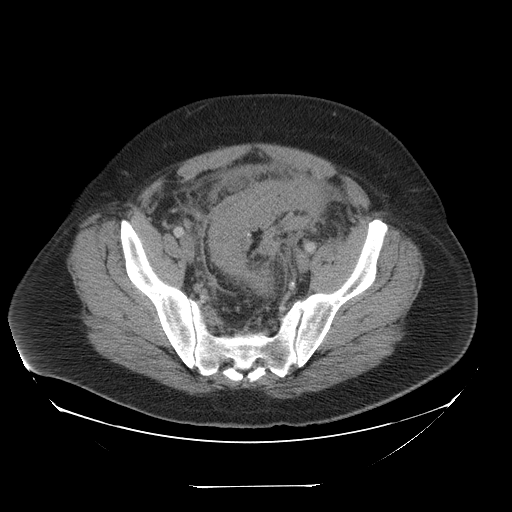
[im 43/99  soft-tissue]
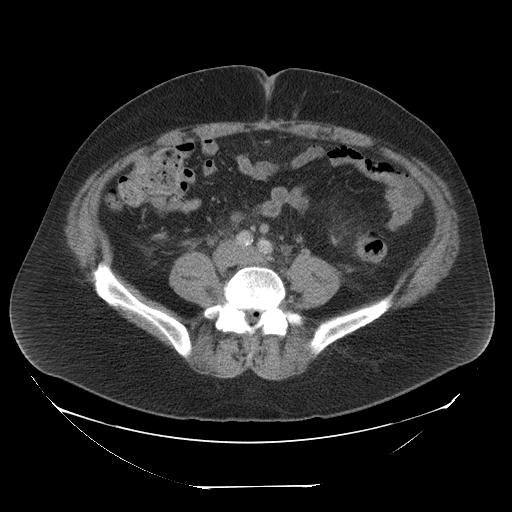
[im 43/99  lung]
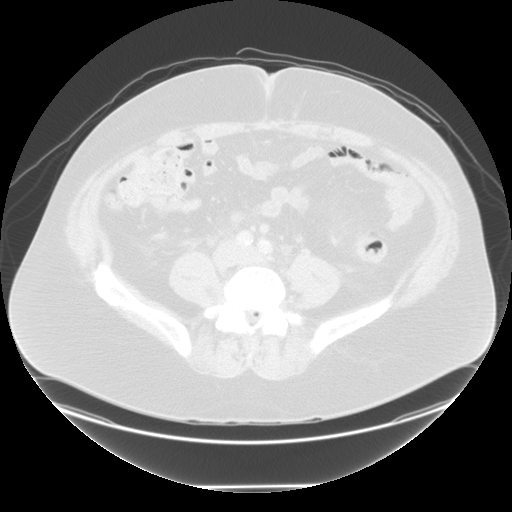
[im 57/99  soft-tissue]
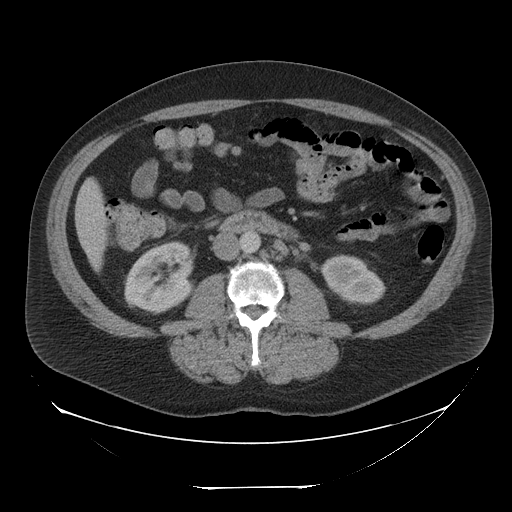
[im 57/99  lung]
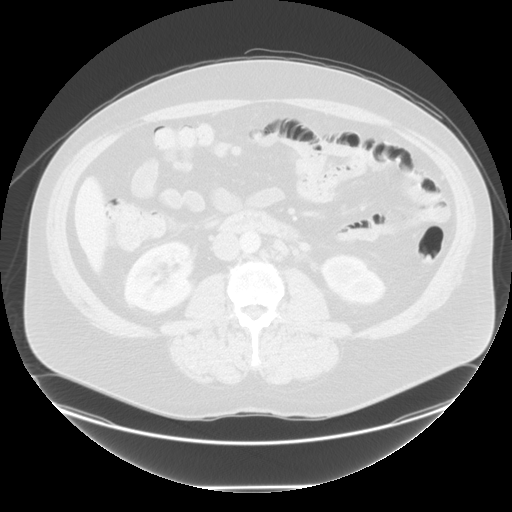
[im 71/99  soft-tissue]
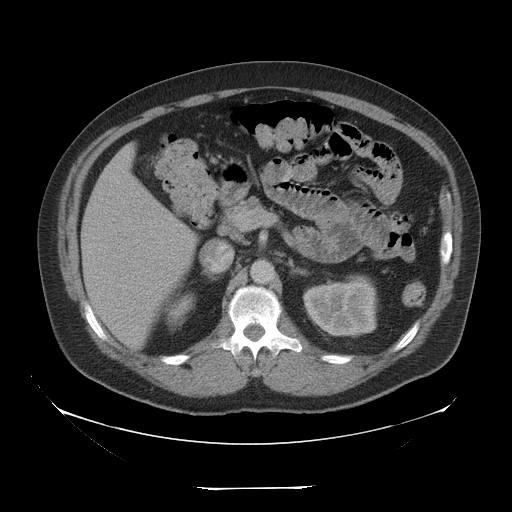
[im 71/99  lung]
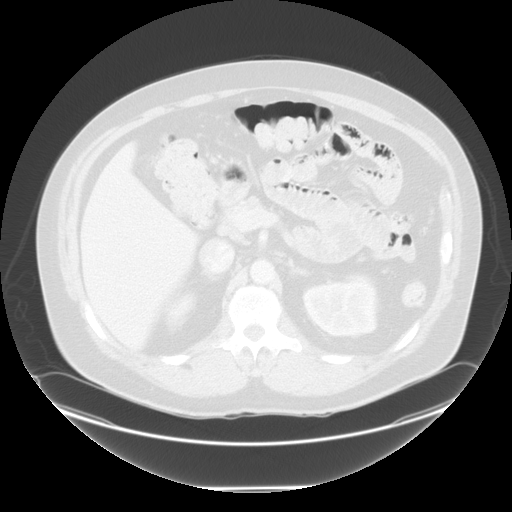
[im 85/99  soft-tissue]
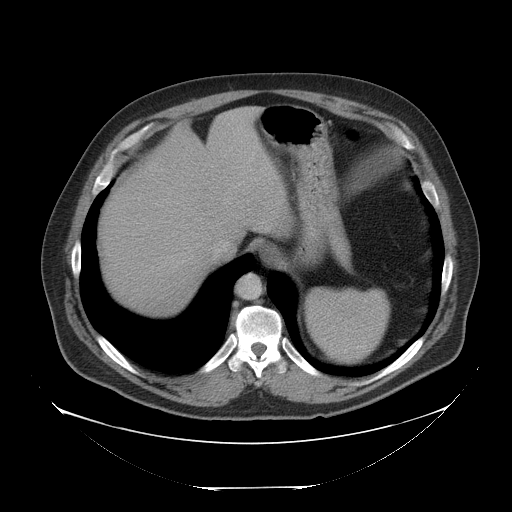
[im 85/99  lung]
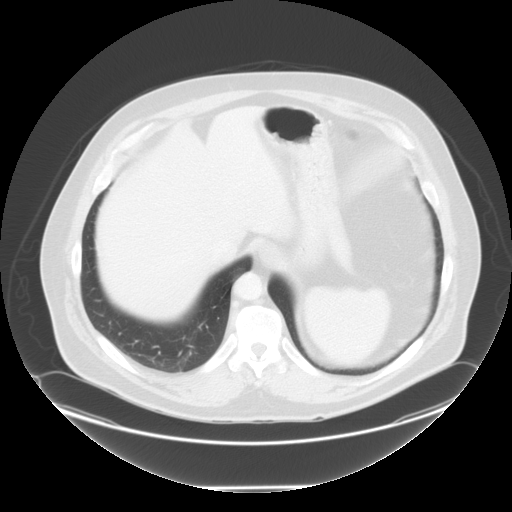

[Series 401: sagittals · sagittal · 1.00mm/px · 8 of 142 slices shown]
[im 15/142  soft-tissue]
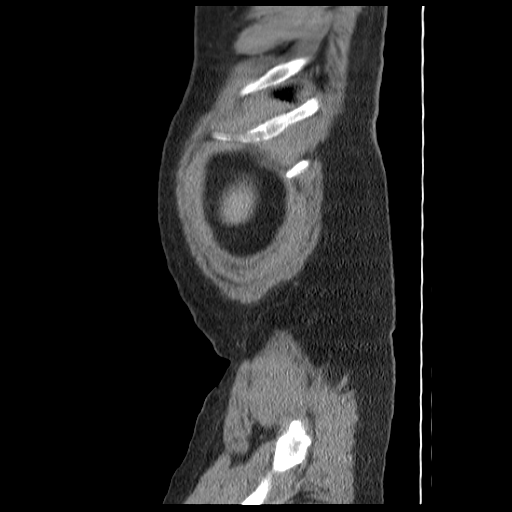
[im 29/142  soft-tissue]
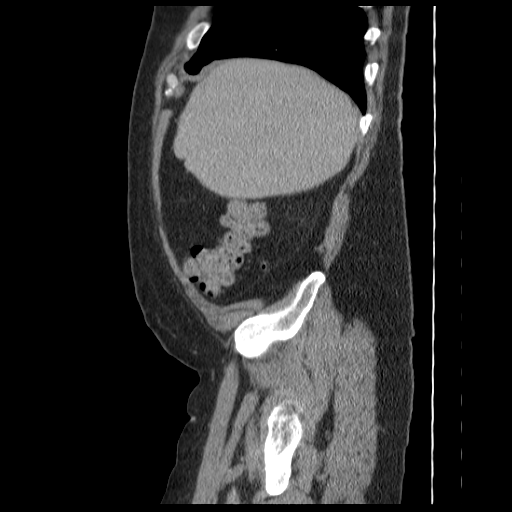
[im 43/142  soft-tissue]
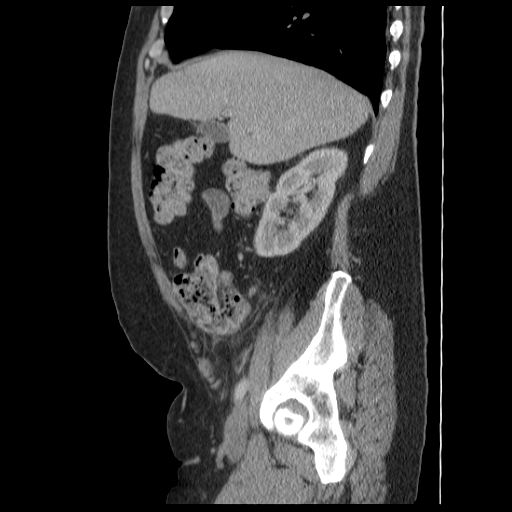
[im 57/142  soft-tissue]
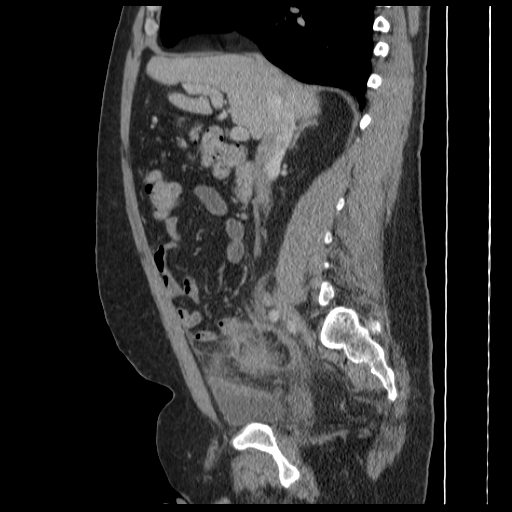
[im 85/142  soft-tissue]
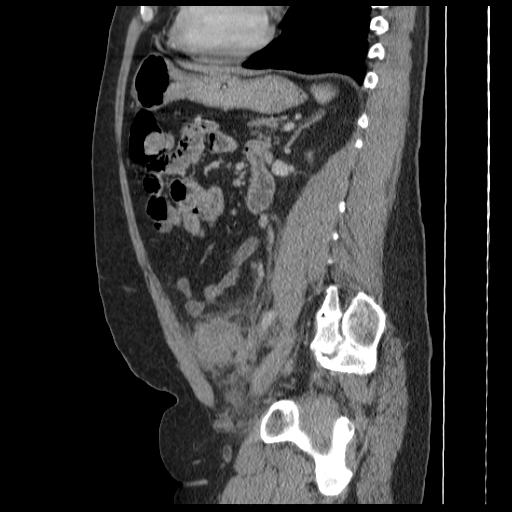
[im 99/142  soft-tissue]
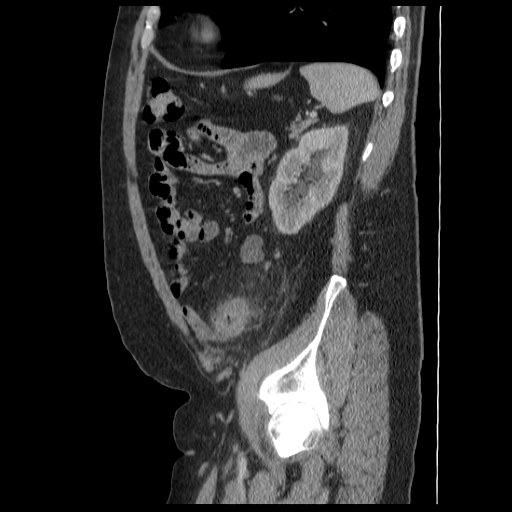
[im 113/142  soft-tissue]
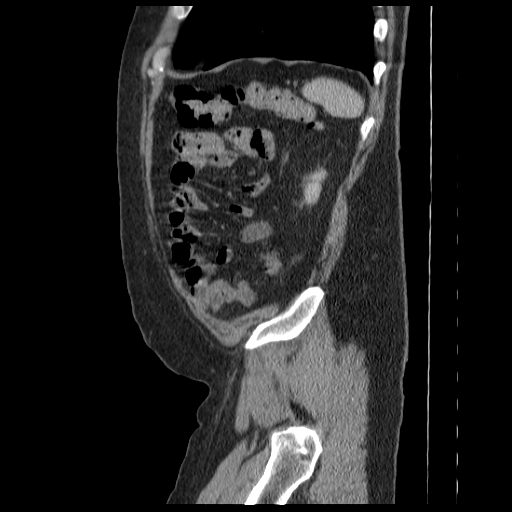
[im 127/142  soft-tissue]
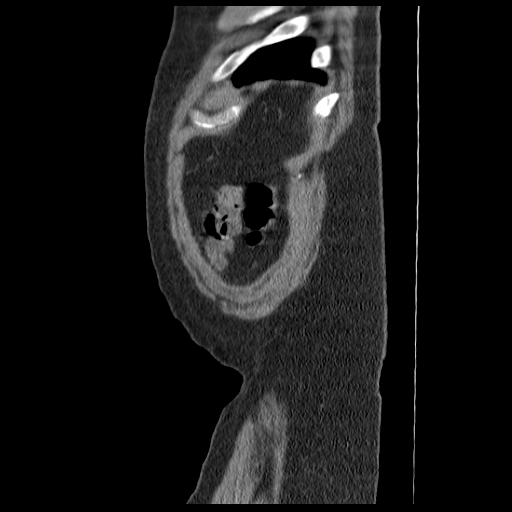

[14 of 32 positions shown; findings below may reference images not displayed]

FINDINGS: The lung bases are clear.  There is no pleural effusion.
The liver, spleen, gallbladder, pancreas and biliary system appear
unremarkable.

There is no adrenal mass.  The right kidney appears normal.  The
left kidney demonstrates mild hydronephrosis with perinephric soft
tissue stranding and delayed contrast excretion.  No ureteral or
bladder calculus is demonstrated.

There is extensive irregular wall thickening of the sigmoid colon
with surrounding inflammation and ill-defined fluid most consistent
with diverticulitis.  There is a 5.3 cm fluid collection between
the seminal vesicles and the rectum on image 78.  No other focal
fluid collections are identified.  The appendix appears normal.
There is bladder wall thickening asymmetric to the left which is
probably related to the adjacent diverticulitis.  No air is present
within the urinary bladder. No significant abnormality of the
prostate gland is seen.

There are no acute or suspicious osseous findings.
IMPRESSION: 1.  Findings are most consistent with severe sigmoid diverticulitis
with focal colitis and atypical neoplasm considered less likely.
There is a 5.3 cm perirectal fluid collection which may reflect an
abscess.
2. Mild left-sided ureterectasis with delayed contrast excretion
consistent with partial ureteral obstruction secondary to the left
lower quadrant inflammatory process.
3.  No evidence of appendicitis or bowel obstruction.

## 2012-01-01 IMAGING — CR DG CHEST 1V
1 series · 1 of 1 positions shown · non-contrast
Comparison: None.

CLINICAL DATA: Chest pain

CHEST - 1 VIEW

[w chest pa]
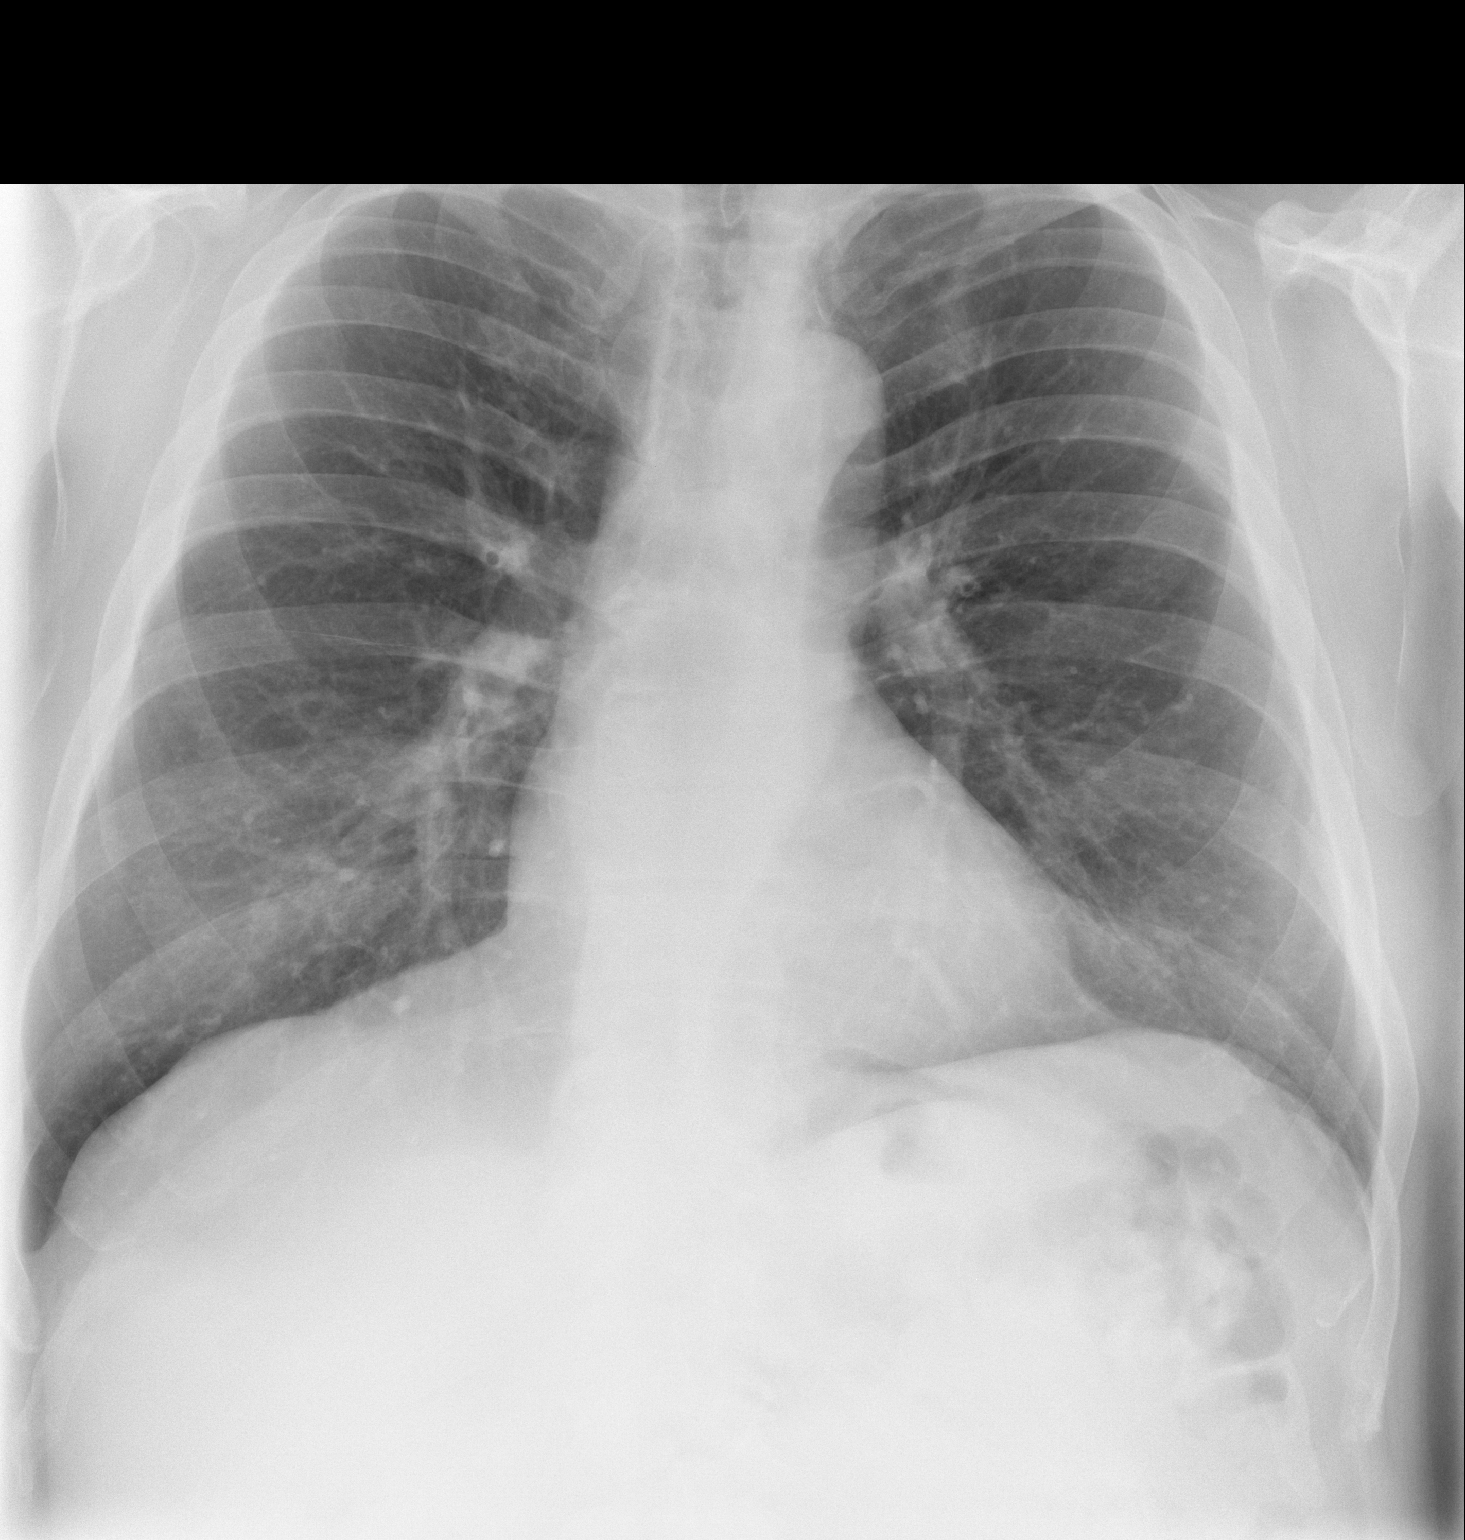

[1 of 1 positions shown; findings below may reference images not displayed]

FINDINGS: The cardiac silhouette is normal in size and
configuration.  No mediastinal or hilar masses or adenopathy noted.
The lungs are clear.  The bony thorax is intact.
IMPRESSION: No active disease of the chest.

## 2012-05-29 ENCOUNTER — Telehealth: Payer: Self-pay | Admitting: *Deleted

## 2012-05-29 MED ORDER — CIPROFLOXACIN HCL 500 MG PO TABS: 500.0000 mg | ORAL_TABLET | Freq: Two times a day (BID) | ORAL | Status: AC

## 2012-05-29 NOTE — Telephone Encounter (Addendum)
Pt called with c/o dysuria, abd pain.   Feels like he has UTI or prostate infection.  Onset 2 weeks ago, he started on cipro and was feeling better but ran out of meds.  He states he got a bottle on # 100 during hospital stay and was told to use when he had symptoms.   He has made an appointment for 1/6.  Hx: prostatitis/BPH

## 2012-05-29 NOTE — Addendum Note (Signed)
Addended by: Genella Mech A on: 05/29/2012 03:04 PM   Modules accepted: Orders

## 2012-05-29 NOTE — Telephone Encounter (Signed)
Will rx script for 1 week of cipro but he definitely needs to be evaluated in clinic. If he calls back with recurrence or worsening symptoms he needs to be seen in ED if unable to come to clinic.   Thanks, Dr. Dorise Hiss

## 2012-06-05 ENCOUNTER — Encounter: Payer: Self-pay | Admitting: Internal Medicine

## 2012-06-05 ENCOUNTER — Ambulatory Visit (INDEPENDENT_AMBULATORY_CARE_PROVIDER_SITE_OTHER): Payer: Self-pay | Admitting: Internal Medicine

## 2012-06-05 VITALS — BP 122/79 | HR 70 | Temp 98.1°F | Ht 71.0 in | Wt 289.7 lb

## 2012-06-05 DIAGNOSIS — N411 Chronic prostatitis: Secondary | ICD-10-CM

## 2012-06-05 DIAGNOSIS — N419 Inflammatory disease of prostate, unspecified: Secondary | ICD-10-CM

## 2012-06-05 DIAGNOSIS — Z23 Encounter for immunization: Secondary | ICD-10-CM

## 2012-06-05 DIAGNOSIS — Z0000 Encounter for general adult medical examination without abnormal findings: Secondary | ICD-10-CM

## 2012-06-05 MED ORDER — CIPROFLOXACIN HCL 500 MG PO TABS: 500.0000 mg | ORAL_TABLET | Freq: Two times a day (BID) | ORAL | Status: AC

## 2012-06-05 NOTE — Patient Instructions (Signed)
1. Please continue to take the ciprofloxacin for another 4 weeks. Please come back to the hospital if this treatment does not work and your symptoms get worse. 2. Please take all medications as prescribed.  3. If you have worsening of your symptoms or new symptoms arise, please call the clinic ([phone]), or go to the ER immediately if symptoms are severe.

## 2012-06-05 NOTE — Assessment & Plan Note (Signed)
-  will give flu shot today.  

## 2012-06-05 NOTE — Progress Notes (Signed)
Patient ID: Chase Morgan, male   DOB: Jun 15, 1955, 57 y.o.   MRN: [id number]  Subjective:   Patient ID: Chase Morgan male   DOB: 01-24-1956 57 y.o.   MRN: [id number]  HPI: Mr.Chase Morgan is a 57 y.o. with past medical history as outlined below, who presents for an acute visit.  Patient reports that he has chronic prostatitis for about 6 years, which flares up approximately once a year. In the past he responded to the ciprofloxacin for this problem. Recently he started having symptoms again for 7-10 days. His symptoms include discomfort, burning and urgency we he urinates. He does not have any pain when he urinates. There's no discharge from his penis.  His PCP gave him a prescription of ciprofloxacin 500 mg twice a day for 7 days. He responded to ciprofloxacin treatment very well. He reports that his symptoms improved significantly, but not completely. He still has mild burning and discomfort currently.   Patient is strongly denies STD possibility, because he has not been sexually active for 2 years. He does not have fever, chills, nausea, vomiting or abdominal pain. There is no pain at the flank areas.     Past Medical History  Diagnosis Date  . Prostatitis 2007    1-2 episodes/year, episodes typically resolve with short course of cipro  . Diverticulitis 2012    2 episodes in June-July 2012, will get colonoscopy from GI in Aug 2012  . Obesity   . Tobacco abuse   . BPH (benign prostatic hyperplasia)    Current Outpatient Prescriptions  Medication Sig Dispense Refill  . calcium citrate-vitamin D (CITRACAL+D) 315-200 MG-UNIT per tablet Take 1 tablet by mouth daily.        . ciprofloxacin (CIPRO) 500 MG tablet Take 1 tablet (500 mg total) by mouth 2 (two) times daily.  14 tablet  0  . Probiotic Product (PROBIOTIC FORMULA PO) Take 1 tablet by mouth daily.        . ciprofloxacin (CIPRO) 500 MG tablet Take 1 tablet (500 mg total) by mouth 2 (two) times daily.  60 tablet  0  . doxazosin  (CARDURA) 4 MG tablet Take 1 tablet (4 mg total) by mouth daily.  30 tablet  11  . Tamsulosin HCl (FLOMAX) 0.4 MG CAPS Take 1 capsule (0.4 mg total) by mouth daily.  30 capsule  3   Family History  Problem Relation Age of Onset  . Hemochromatosis Mother   . Heart disease Father     s/p coronary bypass around age 78's  . Cancer Father     Colon cancer, roughly in age 68's  . Colon cancer Father    History   Social History  . Marital Status: Married    Spouse Name: N/A    Number of Children: 0  . Years of Education: N/A   Occupational History  . Holiday representative   .     Social History Main Topics  . Smoking status: Current Some Day Smoker -- 0.5 packs/day for 33 years    Types: Cigarettes  . Smokeless tobacco: Never Used     Comment: thinking about  . Alcohol Use: 0.0 oz/week    2-4 Cans of beer per week     Comment: DRINKS VERY LITTLE IF ANY  . Drug Use: No  . Sexually Active: None   Other Topics Concern  . None   Social History Narrative  . None   Review of Systems:  General: no fevers, chills, no  changes in body weight, no changes in appetite Skin: no rash HEENT: no blurry vision, hearing changes or sore throat Pulm: no dyspnea, coughing, wheezing CV: no chest pain, palpitations, shortness of breath Abd: no nausea/vomiting, abdominal pain, diarrhea/constipation GU: has burning on urination  Ext: no arthralgias, myalgias Neuro: no weakness, numbness, or tingling   Objective:  Physical Exam: Filed Vitals:   06/05/12 1319  BP: 122/79  Pulse: 70  Temp: 98.1 F (36.7 C)  TempSrc: Oral  Height: 5\' 11"  (1.803 m)  Weight: 289 lb 11.2 oz (131.407 kg)  SpO2: 98%   General: resting in bed, not in acute distress HEENT: PERRL, EOMI, no scleral icterus Cardiac: S1/S2, RRR, No murmurs, gallops or rubs Pulm: Good air movement bilaterally, Clear to auscultation bilaterally, No rales, wheezing, rhonchi or rubs. Abd: Soft,  nondistended, nontender, no rebound pain, no  organomegaly, BS present Ext: No rashes or edema, 2+DP/PT pulse bilaterally Musculoskeletal: No joint deformities, erythema, or stiffness, ROM full and no nontender Skin: no rashes. No skin bruise. Neuro: alert and oriented X3, cranial nerves II-XII grossly intact, muscle strength 5/5 in all extremeties,  sensation to light touch intact.  Psych.: patient is not psychotic, no suicidal or hemocidal ideation.   Assessment & Plan:

## 2012-06-05 NOTE — Assessment & Plan Note (Signed)
Patient's symptoms is most likely caused by flareup of chronic prostatitis. Currently patient does not have fever or chills. Physical examination showed no CVA tenderness for pyelonephritis. I did not do prostate examination at this acute setting to avoid any possibility of septic shock.  It is less likely that the patient has STD given inactive sexual activity for 2 years. In addition, patient responded to one week of ciprofloxacin treatment with significantly improvement of symptoms.   -Will continue ciprofloxacin for another 4 weeks. Patient was instructed to come back to the hospital if his symptoms get worse.  -If the patient's symptoms do not improve after completion of 4 weeks of antibiotics, will consider giving patient a referral to neurologist.

## 2012-06-30 ENCOUNTER — Encounter: Payer: Self-pay | Admitting: Internal Medicine

## 2012-07-14 ENCOUNTER — Encounter: Payer: Self-pay | Admitting: Internal Medicine

## 2013-03-06 ENCOUNTER — Ambulatory Visit: Payer: Self-pay

## 2013-03-06 ENCOUNTER — Encounter: Payer: Self-pay | Admitting: Internal Medicine

## 2013-03-06 ENCOUNTER — Ambulatory Visit (INDEPENDENT_AMBULATORY_CARE_PROVIDER_SITE_OTHER): Payer: Self-pay | Admitting: Internal Medicine

## 2013-03-06 VITALS — BP 123/82 | HR 64 | Temp 97.8°F | Ht 71.0 in | Wt 289.9 lb

## 2013-03-06 DIAGNOSIS — R198 Other specified symptoms and signs involving the digestive system and abdomen: Secondary | ICD-10-CM

## 2013-03-06 DIAGNOSIS — K5792 Diverticulitis of intestine, part unspecified, without perforation or abscess without bleeding: Secondary | ICD-10-CM

## 2013-03-06 LAB — BASIC METABOLIC PANEL WITH GFR
CO2: 25 mEq/L (ref 19–32)
Chloride: 103 mEq/L (ref 96–112)
Creat: 0.9 mg/dL (ref 0.50–1.35)
GFR, Est Non African American: >89 mL/min
Potassium: 4.3 mEq/L (ref 3.5–5.3)
Sodium: 137 mEq/L (ref 135–145)

## 2013-03-06 LAB — CBC WITH DIFFERENTIAL/PLATELET
Basophils Absolute: 0 10*3/uL (ref 0.0–0.1)
HCT: 44 % (ref 39.0–52.0)
Lymphocytes Relative: 32 % (ref 12–46)
Lymphs Abs: 2.4 10*3/uL (ref 0.7–4.0)
MCV: 86.3 fL (ref 78.0–100.0)
Monocytes Absolute: 0.8 10*3/uL (ref 0.1–1.0)
Neutro Abs: 4.2 10*3/uL (ref 1.7–7.7)
RBC: 5.1 MIL/uL (ref 4.22–5.81)
RDW: 13.7 % (ref 11.5–15.5)
WBC: 7.4 10*3/uL (ref 4.0–10.5)

## 2013-03-06 LAB — SEDIMENTATION RATE: Sed Rate: 12 mm/hr (ref 0–16)

## 2013-03-06 LAB — C-REACTIVE PROTEIN: CRP: 4.8 mg/dL — ABNORMAL HIGH

## 2013-03-06 NOTE — Assessment & Plan Note (Addendum)
Patient's concern of acute diverticulitis is not supported by history or physical examination findings. The next best step would have been an abdominal CT scan, but declined this test to due to cost/lack of insurance. He reports to me that is still struggling to clear bill from the hospitalization of 2012.  Plan - abdominal CT scan deferred due to cost - Discussed with the patient at length advising him not to continue with antibiotics without any evidence of acute diverticulitis. Specifically I mentioned to him the dangers of antibiotics, including C. Difficile and development of anti microbial resistance. -  Encouraged him to call the clinic, if he develops abdominal pain, increased, diarrhea, nausea, vomiting, fevers or chills. -I will order some basic labs including CBC with differential, ESR, CRP and BMP. If these raise the possibility of acute diverticulitis,we might consider antibiotics. - he will follow up if the symptoms persist or worsen

## 2013-03-06 NOTE — Patient Instructions (Signed)
I will check some labs and if these show sings of diverticulitis, I will give you a call  Otherwise you don't need antibiotics today Please call clinic with questions.

## 2013-03-06 NOTE — Progress Notes (Signed)
Patient ID: KADON ANDRUS, male   DOB: [birth year], 57 y.o.   MRN: [id number]   Subjective:   HPI: Mr.Estelle E Haffey is a 57 y.o. gentleman with past medical history of diverticulitis, recurrent prostatitis, obesity, and cigarette smoking, presents to the clinic with a concern of a diverticulitis flare.  Patient reports that for about 5 days, he's been experiencing what he describes as irritation in his rectum when he is passing stool. He denies diarrhea. He denied bloody stools, abdominal pain, nausea, vomiting, fevers, increased fatigue, or any other constitutional symptoms. He has been able to continue with his job despite the irritation. BM frequency has been normal. He also reports that his been taking self prescribed ciprofloxacin 500 mg twice a day for the last 5 days and he feels that this has helped his symptoms. Patient is very concerned about the possibility of diverticulitis flare and he requests for full prescription of antibiotics.  He was previously hospitalized in the summer of 2012, with complicated diverticulitis requiring a prolonged course of antibiotics.  Colonoscopy on 01/21/2011: Diverticulitis. GI recommended surgery but the patient is hesitant to proceed. He feels that since his episode in 2012. His been relatively stable.    Past Medical History  Diagnosis Date  . Prostatitis 2007    1-2 episodes/year, episodes typically resolve with short course of cipro  . Diverticulitis 2012    2 episodes in June-July 2012, will get colonoscopy from GI in Aug 2012  . Obesity   . Tobacco abuse   . BPH (benign prostatic hyperplasia)    Current Outpatient Prescriptions  Medication Sig Dispense Refill  . calcium citrate-vitamin D (CITRACAL+D) 315-200 MG-UNIT per tablet Take 1 tablet by mouth daily.        Marland Kitchen doxazosin (CARDURA) 4 MG tablet Take 1 tablet (4 mg total) by mouth daily.  30 tablet  11  . Probiotic Product (PROBIOTIC FORMULA PO) Take 1 tablet by mouth daily.        .  Tamsulosin HCl (FLOMAX) 0.4 MG CAPS Take 1 capsule (0.4 mg total) by mouth daily.  30 capsule  3   No current facility-administered medications for this visit.   Family History  Problem Relation Age of Onset  . Hemochromatosis Mother   . Heart disease Father     s/p coronary bypass around age 34's  . Cancer Father     Colon cancer, roughly in age 32's  . Colon cancer Father    History   Social History  . Marital Status: Married    Spouse Name: N/A    Number of Children: 0  . Years of Education: N/A   Occupational History  . Holiday representative   .     Social History Main Topics  . Smoking status: Current Some Day Smoker -- 0.50 packs/day for 33 years    Types: Cigarettes  . Smokeless tobacco: Never Used     Comment: thinking about.ready to quit.  Wants patches for.  . Alcohol Use: 0.0 oz/week    2-4 Cans of beer per week     Comment: DRINKS VERY LITTLE IF ANY  . Drug Use: No  . Sexual Activity: None   Other Topics Concern  . None   Social History Narrative  . None   Review of Systems: Constitutional: Denies fever, chills, diaphoresis, appetite change and fatigue.  Respiratory: Denies SOB, DOE, cough, chest tightness, and wheezing. Denies chest pain. Cardiovascular: No chest pain, palpitations and leg swelling.  Genitourinary: No dysuria, frequency,  hematuria, or flank pain.  Musculoskeletal: No myalgias, back pain, joint swelling, arthralgias  Psych: No depression symptoms. No SI or SA.   Objective:  Physical Exam: Filed Vitals:   03/06/13 1421  BP: 123/82  Pulse: 64  Temp: 97.8 F (36.6 C)  TempSrc: Oral  Height: 5\' 11"  (1.803 m)  Weight: 289 lb 14.4 oz (131.498 kg)  SpO2: 97%   General: Well nourished. No acute distress.  HEENT: Normal oral mucosa. MMM.  Lungs: CTA bilaterally. Heart: RRR; no extra sounds or murmurs  Abdomen: Non-distended, normal BS, soft, nontender; no hepatosplenomegaly. Rectal exam is normal Extremities: No pedal edema. No joint  swelling or tenderness. Neurologic: Normal EOM,  Alert and oriented x3. No obvious neurologic/cranial nerve deficits.  Assessment & Plan:  I have discussed my assessment and plan  with  my attending in the clinic, Dr. Criselda Peaches  as detailed under problem based charting.

## 2013-03-09 NOTE — Progress Notes (Signed)
Case discussed with Dr.Kazibwe at the time of the visit.  We reviewed the resident's history and exam and pertinent patient test results.  I agree with the assessment, diagnosis, and plan of care documented in the resident's note.    

## 2013-05-11 ENCOUNTER — Encounter: Payer: Self-pay | Admitting: Internal Medicine

## 2014-09-18 ENCOUNTER — Encounter: Payer: Self-pay | Admitting: Internal Medicine

## 2014-09-18 ENCOUNTER — Ambulatory Visit (INDEPENDENT_AMBULATORY_CARE_PROVIDER_SITE_OTHER): Payer: Self-pay | Admitting: Internal Medicine

## 2014-09-18 VITALS — BP 140/89 | HR 89 | Temp 98.7°F | Wt 285.2 lb

## 2014-09-18 DIAGNOSIS — Z720 Tobacco use: Secondary | ICD-10-CM

## 2014-09-18 DIAGNOSIS — R3915 Urgency of urination: Secondary | ICD-10-CM

## 2014-09-18 DIAGNOSIS — C4491 Basal cell carcinoma of skin, unspecified: Secondary | ICD-10-CM | POA: Insufficient documentation

## 2014-09-18 DIAGNOSIS — R1032 Left lower quadrant pain: Secondary | ICD-10-CM

## 2014-09-18 DIAGNOSIS — R030 Elevated blood-pressure reading, without diagnosis of hypertension: Secondary | ICD-10-CM

## 2014-09-18 DIAGNOSIS — N529 Male erectile dysfunction, unspecified: Secondary | ICD-10-CM

## 2014-09-18 DIAGNOSIS — R6882 Decreased libido: Secondary | ICD-10-CM

## 2014-09-18 DIAGNOSIS — Z0000 Encounter for general adult medical examination without abnormal findings: Secondary | ICD-10-CM

## 2014-09-18 DIAGNOSIS — IMO0001 Reserved for inherently not codable concepts without codable children: Secondary | ICD-10-CM | POA: Insufficient documentation

## 2014-09-18 LAB — LIPID PANEL
CHOL/HDL RATIO: 4.5 ratio
Cholesterol: 171 mg/dL (ref 0–200)
HDL: 38 mg/dL — AB (ref >=40)
LDL Cholesterol: 96 mg/dL (ref 0–99)
TRIGLYCERIDES: 183 mg/dL — AB (ref <150)
VLDL: 37 mg/dL (ref 0–40)

## 2014-09-18 LAB — CBC WITH DIFFERENTIAL/PLATELET
Basophils Absolute: 0 10*3/uL (ref 0.0–0.1)
Basophils Relative: 0 % (ref 0–1)
EOS PCT: 1 % (ref 0–5)
Eosinophils Absolute: 0.1 10*3/uL (ref 0.0–0.7)
HCT: 48 % (ref 39.0–52.0)
HEMOGLOBIN: 16.3 g/dL (ref 13.0–17.0)
LYMPHS ABS: 1.9 10*3/uL (ref 0.7–4.0)
LYMPHS PCT: 26 % (ref 12–46)
MCH: 30.3 pg (ref 26.0–34.0)
MCHC: 34 g/dL (ref 30.0–36.0)
MCV: 89.2 fL (ref 78.0–100.0)
MPV: 11.3 fL (ref 8.6–12.4)
Monocytes Absolute: 0.7 10*3/uL (ref 0.1–1.0)
Monocytes Relative: 10 % (ref 3–12)
NEUTROS PCT: 63 % (ref 43–77)
Neutro Abs: 4.5 10*3/uL (ref 1.7–7.7)
Platelets: 201 10*3/uL (ref 150–400)
RBC: 5.38 MIL/uL (ref 4.22–5.81)
RDW: 13.7 % (ref 11.5–15.5)
WBC: 7.2 10*3/uL (ref 4.0–10.5)

## 2014-09-18 LAB — COMPLETE METABOLIC PANEL WITH GFR
ALT: 12 U/L (ref 0–53)
AST: 18 U/L (ref 0–37)
Albumin: 4.2 g/dL (ref 3.5–5.2)
Alkaline Phosphatase: 99 U/L (ref 39–117)
BILIRUBIN TOTAL: 0.4 mg/dL (ref 0.2–1.2)
BUN: 15 mg/dL (ref 6–23)
CALCIUM: 9.4 mg/dL (ref 8.4–10.5)
CO2: 26 meq/L (ref 19–32)
CREATININE: 0.93 mg/dL (ref 0.50–1.35)
Chloride: 105 mEq/L (ref 96–112)
GLUCOSE: 95 mg/dL (ref 70–99)
Potassium: 4.4 mEq/L (ref 3.5–5.3)
Sodium: 140 mEq/L (ref 135–145)
TOTAL PROTEIN: 7 g/dL (ref 6.0–8.3)

## 2014-09-18 MED ORDER — CIPROFLOXACIN HCL 500 MG PO TABS: 500.0000 mg | ORAL_TABLET | Freq: Two times a day (BID) | ORAL | Status: DC

## 2014-09-18 MED ORDER — NICOTINE 21 MG/24HR TD PT24: 21.0000 mg | MEDICATED_PATCH | Freq: Every day | TRANSDERMAL | Status: AC

## 2014-09-18 MED ORDER — METRONIDAZOLE 500 MG PO TABS: 500.0000 mg | ORAL_TABLET | Freq: Three times a day (TID) | ORAL | Status: DC

## 2014-09-18 NOTE — Assessment & Plan Note (Signed)
Will f/u with derm and get tx 10/15/14 Encouraged skin cancer

## 2014-09-18 NOTE — Patient Instructions (Addendum)
Please take Cipro 500 mg 2x per day for 10 days and Flagyl 500 mg 3xper day for 10 days  Follow up in 1 week sooner if severe abdominal pain, not passing gas, still no bowel movement, nausea/vomiting, decreased ability to eat/fever/chills.  Your blood pressure  Is slightly high 142/87 then 140/79. It should be <140/90 please try diet and exercise. If it continues to be high we may need to put you on a medication    Diverticulitis Diverticulitis is inflammation or infection of small pouches in your colon that form when you have a condition called diverticulosis. The pouches in your colon are called diverticula. Your colon, or large intestine, is where water is absorbed and stool is formed. Complications of diverticulitis can include:  Bleeding.  Severe infection.  Severe pain.  Perforation of your colon.  Obstruction of your colon. CAUSES  Diverticulitis is caused by bacteria. Diverticulitis happens when stool becomes trapped in diverticula. This allows bacteria to grow in the diverticula, which can lead to inflammation and infection. RISK FACTORS People with diverticulosis are at risk for diverticulitis. Eating a diet that does not include enough fiber from fruits and vegetables may make diverticulitis more likely to develop. SYMPTOMS  Symptoms of diverticulitis may include:  Abdominal pain and tenderness. The pain is normally located on the left side of the abdomen, but may occur in other areas.  Fever and chills.  Bloating.  Cramping.  Nausea.  Vomiting.  Constipation.  Diarrhea.  Blood in your stool. DIAGNOSIS  Your health care provider will ask you about your medical history and do a physical exam. You may need to have tests done because many medical conditions can cause the same symptoms as diverticulitis. Tests may include:  Blood tests.  Urine tests.  Imaging tests of the abdomen, including X-rays and CT scans. When your condition is under control, your  health care provider may recommend that you have a colonoscopy. A colonoscopy can show how severe your diverticula are and whether something else is causing your symptoms. TREATMENT  Most cases of diverticulitis are mild and can be treated at home. Treatment may include:  Taking over-the-counter pain medicines.  Following a clear liquid diet.  Taking antibiotic medicines by mouth for 7-10 days. More severe cases may be treated at a hospital. Treatment may include:  Not eating or drinking.  Taking prescription pain medicine.  Receiving antibiotic medicines through an IV tube.  Receiving fluids and nutrition through an IV tube.  Surgery. HOME CARE INSTRUCTIONS   Follow your health care provider's instructions carefully.  Follow a full liquid diet or other diet as directed by your health care provider. After your symptoms improve, your health care provider may tell you to change your diet. He or she may recommend you eat a high-fiber diet. Fruits and vegetables are good sources of fiber. Fiber makes it easier to pass stool.  Take fiber supplements or probiotics as directed by your health care provider.  Only take medicines as directed by your health care provider.  Keep all your follow-up appointments. SEEK MEDICAL CARE IF:   Your pain does not improve.  You have a hard time eating food.  Your bowel movements do not return to normal. SEEK IMMEDIATE MEDICAL CARE IF:   Your pain becomes worse.  Your symptoms do not get better.  Your symptoms suddenly get worse.  You have a fever.  You have repeated vomiting.  You have bloody or black, tarry stools. MAKE SURE YOU:  Understand these instructions.  Will watch your condition.  Will get help right away if you are not doing well or get worse. Document Released: 02/24/2005 Document Revised: 05/22/2013 Document Reviewed: 04/11/2013 [facility] Patient Information 2015 [place], [place]. This information is not intended to  replace advice given to you by your health care provider. Make sure you discuss any questions you have with your health care provider.  Prostatitis The prostate gland is about the size and shape of a walnut. It is located just below your bladder. It produces one of the components of semen, which is made up of sperm and the fluids that help nourish and transport it out from the testicles. Prostatitis is inflammation of the prostate gland.  There are four types of prostatitis:  Acute bacterial prostatitis. This is the least common type of prostatitis. It starts quickly and usually is associated with a bladder infection, high fever, and shaking chills. It can occur at any age.  Chronic bacterial prostatitis. This is a persistent bacterial infection in the prostate. It usually develops from repeated acute bacterial prostatitis or acute bacterial prostatitis that was not properly treated. It can occur in men of any age but is most common in middle-aged men whose prostate has begun to enlarge. The symptoms are not as severe as those in acute bacterial prostatitis. Discomfort in the part of your body that is in front of your rectum and below your scrotum (perineum), lower abdomen, or in the head of your penis (glans) may represent your primary discomfort.  Chronic prostatitis (nonbacterial). This is the most common type of prostatitis. It is inflammation of the prostate gland that is not caused by a bacterial infection. The cause is unknown and may be associated with a viral infection or autoimmune disorder.  Prostatodynia (pelvic floor disorder). This is associated with increased muscular tone in the pelvis surrounding the prostate. CAUSES The causes of bacterial prostatitis are bacterial infection. The causes of the other types of prostatitis are unknown.  SYMPTOMS  Symptoms can vary depending upon the type of prostatitis that exists. There can also be overlap in symptoms. Possible symptoms for each  type of prostatitis are listed below. Acute Bacterial Prostatitis  Painful urination.  Fever or chills.  Muscle or joint pains.  Low back pain.  Low abdominal pain.  Inability to empty bladder completely. Chronic Bacterial Prostatitis, Chronic Nonbacterial Prostatitis, and Prostatodynia  Sudden urge to urinate.  Frequent urination.  Difficulty starting urine stream.  Weak urine stream.  Discharge from the urethra.  Dribbling after urination.  Rectal pain.  Pain in the testicles, penis, or tip of the penis.  Pain in the perineum.  Problems with sexual function.  Painful ejaculation.  Bloody semen. DIAGNOSIS  In order to diagnose prostatitis, your health care provider will ask about your symptoms. One or more urine samples will be taken and tested (urinalysis). If the urinalysis result is negative for bacteria, your health care provider may use a finger to feel your prostate (digital rectal exam). This exam helps your health care provider determine if your prostate is swollen and tender. It will also produce a specimen of semen that can be analyzed. TREATMENT  Treatment for prostatitis depends on the cause. If a bacterial infection is the cause, it can be treated with antibiotic medicine. In cases of chronic bacterial prostatitis, the use of antibiotics for up to 1 month or 6 weeks may be necessary. Your health care provider may instruct you to take sitz baths to help relieve  pain. A sitz bath is a bath of hot water in which your hips and buttocks are under water. This relaxes the pelvic floor muscles and often helps to relieve the pressure on your prostate. HOME CARE INSTRUCTIONS   Take all medicines as directed by your health care provider.  Take sitz baths as directed by your health care provider. SEEK MEDICAL CARE IF:   Your symptoms get worse, not better.  You have a fever. SEEK IMMEDIATE MEDICAL CARE IF:   You have chills.  You feel nauseous or  vomit.  You feel lightheaded or faint.  You are unable to urinate.  You have blood or blood clots in your urine. MAKE SURE YOU:  Understand these instructions.  Will watch your condition.  Will get help right away if you are not doing well or get worse. Document Released: 05/14/2000 Document Revised: 05/22/2013 Document Reviewed: 12/04/2012 [facility] Patient Information 2015 [place], [place]. This information is not intended to replace advice given to you by your health care provider. Make sure you discuss any questions you have with your health care provider.  Erectile Dysfunction Erectile dysfunction is the inability to get or sustain a good enough erection to have sexual intercourse. Erectile dysfunction may involve:  Inability to get an erection.  Lack of enough hardness to allow penetration.  Loss of the erection before sex is finished.  Premature ejaculation. CAUSES  Certain drugs, such as:  Pain relievers.  Antihistamines.  Antidepressants.  Blood pressure medicines.  Water pills (diuretics).  Ulcer medicines.  Muscle relaxants.  Illegal drugs.  Excessive drinking.  Psychological causes, such as:  Anxiety.  Depression.  Sadness.  Exhaustion.  Performance fear.  Stress.  Physical causes, such as:  Artery problems. This may include diabetes, smoking, liver disease, or atherosclerosis.  High blood pressure.  Hormonal problems, such as low testosterone.  Obesity.  Nerve problems. This may include back or pelvic injuries, diabetes mellitus, multiple sclerosis, or Parkinson disease. SYMPTOMS  Inability to get an erection.  Lack of enough hardness to allow penetration.  Loss of the erection before sex is finished.  Premature ejaculation.  Normal erections at some times, but with frequent unsatisfactory episodes.  Orgasms that are not satisfactory in sensation or frequency.  Low sexual satisfaction in either partner because of  erection problems.  A curved penis occurring with erection. The curve may cause pain or may be too curved to allow for intercourse.  Never having nighttime erections. DIAGNOSIS Your caregiver can often diagnose this condition by:  Performing a physical exam to find other diseases or specific problems with the penis.  Asking you detailed questions about the problem.  Performing blood tests to check for diabetes mellitus or to measure hormone levels.  Performing urine tests to find other underlying health conditions.  Performing an ultrasound exam to check for scarring.  Performing a test to check blood flow to the penis.  Doing a sleep study at home to measure nighttime erections. TREATMENT   You may be prescribed medicines by mouth.  You may be given medicine injections into the penis.  You may be prescribed a vacuum pump with a ring.  Penile implant surgery may be performed. You may receive:  An inflatable implant.  A semirigid implant.  Blood vessel surgery may be performed. HOME CARE INSTRUCTIONS  If you are prescribed oral medicine, you should take the medicine as prescribed. Do not increase the dosage without first discussing it with your physician.  If you are using self-injections,  be careful to avoid any veins that are on the surface of the penis. Apply pressure to the injection site for 5 minutes.  If you are using a vacuum pump, make sure you have read the instructions before using it. Discuss any questions with your physician before taking the pump home. SEEK MEDICAL CARE IF:  You experience pain that is not responsive to the pain medicine you have been prescribed.  You experience nausea or vomiting. SEEK IMMEDIATE MEDICAL CARE IF:   When taking oral or injectable medications, you experience an erection that lasts longer than 4 hours. If your physician is unavailable, go to the nearest emergency room for evaluation. An erection that lasts much longer than  4 hours can result in permanent damage to your penis.  You have pain that is severe.  You develop redness, severe pain, or severe swelling of your penis.  You have redness spreading up into your groin or lower abdomen.  You are unable to pass your urine. Document Released: 05/14/2000 Document Revised: 01/17/2013 Document Reviewed: 10/19/2012 [facility] Patient Information 2015 [place], [place]. This information is not intended to replace advice given to you by your health care provider. Make sure you discuss any questions you have with your health care provider.

## 2014-09-18 NOTE — Assessment & Plan Note (Signed)
Also assoc with erectile dysfunction  Pt requested testosterone check will check today  Given info about libido

## 2014-09-18 NOTE — Assessment & Plan Note (Addendum)
?   Prostatitis will empirically tx for. Will check UA today  Will empirically tx with Cipro 500 mg bid x 10 days

## 2014-09-18 NOTE — Progress Notes (Signed)
INTERNAL MEDICINE TEACHING ATTENDING ADDENDUM - Nechuma Boven, MD: I reviewed and discussed at the time of visit with the resident Dr. McLean, the patient's medical history, physical examination, diagnosis and results of pertinent tests and treatment and I agree with the patient's care as documented.  

## 2014-09-18 NOTE — Assessment & Plan Note (Signed)
Concern for diverticulitis with intermittently LLQ  will empirically tx with Cipro 500 mg bid x 10 d and Flagyl 500 mg tid x 10 days  If worse come for eval and CT ab/pelvis stat to eval for diverticulitis or SBO as in the past F/u in 1 week prn  Will check CMET, CBC, UA

## 2014-09-18 NOTE — Assessment & Plan Note (Signed)
?   Prostatitis will empirically tx for. Will check UA today  Will empirically tx with Cipro 500 mg bid x 10 days

## 2014-09-18 NOTE — Assessment & Plan Note (Addendum)
Due for colonoscopy 12/2015 with [facility]  Will check lipid panel today

## 2014-09-18 NOTE — Assessment & Plan Note (Signed)
Will try Nicotine patch 21 mg x 6 weeks and titrate down if compliant  Pt motivated and wants to quit

## 2014-09-18 NOTE — Assessment & Plan Note (Signed)
Will re check BP at f/u if still elevated significantly consider tx with BP medication

## 2014-09-18 NOTE — Progress Notes (Signed)
   Subjective:    Patient ID: Chase Morgan, male    DOB: 04-Jun-1955, 59 y.o.   MRN: [id number]  HPI Comments: 59 y.o pmh tobacco abuse, prostatitis, diverticulitis with partial sbo, obesity, bph though states resolved   He presents for f/u 1. LLQ ab pain which is intermittent x 3-4 days 3-4/10 w/o radiation. When pain comes on it is really uncomfortable and he noticed it "uncomfortable feeling" when he passed gas though he is still passing gas.  He states pain seems likely same pain when he had diverticulitis or prostatitis.  He states he took milk of Mag and only had a liq amt of stool come out.  He denies fever/chills, nausea/vomiting and is able to tolerate po. Reviewed previous colonoscopy 2012 with diverticulitis partial SBO due for repeat 12/2015 reviewed with patient and aware when should f/u with [facility] . He reports he has not problems for 2 years until now  2. He c/o urinary urgency, nocturia 2 x per night, no dysuria, no hematuria, or dysuria or stream change.  He states warm urination.    3. Tobacco abuse-wife wants him to quit. Smoking 1/2 ppd x long time. He is interested in quiting  4. BP 142/87 then 140/89. No meds disc'ed with pt will repeat BP at f/u   [employer] contractor     Review of Systems  Constitutional: Negative for fever and chills.  Respiratory: Negative for shortness of breath.   Gastrointestinal: Positive for abdominal pain and constipation. Negative for nausea, vomiting and blood in stool.  Genitourinary: Positive for urgency. Negative for dysuria, frequency and hematuria.       Objective:   Physical Exam  Constitutional: He is oriented to person, place, and time. He appears well-developed and well-nourished. He is cooperative.  HENT:  Head: Normocephalic and atraumatic.  Eyes: Conjunctivae are normal. Right eye exhibits no discharge. Left eye exhibits no discharge. No scleral icterus.  Cardiovascular: Normal rate, regular rhythm, S1 normal, S2  normal and normal heart sounds.   No murmur heard. No leg edema   Pulmonary/Chest: Effort normal and breath sounds normal.  Abdominal: Soft. Bowel sounds are normal. He exhibits no distension. There is no tenderness.    Musculoskeletal: He exhibits no edema.  Neurological: He is alert and oriented to person, place, and time. Gait normal.  Skin: Skin is warm, dry and intact.     Psychiatric: He has a normal mood and affect. His speech is normal and behavior is normal. Judgment and thought content normal. Cognition and memory are normal.  Nursing note and vitals reviewed.         Assessment & Plan:  F/u in 1 week prn otherwise 6 months

## 2014-09-19 ENCOUNTER — Encounter: Payer: Self-pay | Admitting: Internal Medicine

## 2014-09-19 DIAGNOSIS — R7989 Other specified abnormal findings of blood chemistry: Secondary | ICD-10-CM | POA: Insufficient documentation

## 2014-09-19 LAB — TESTOSTERONE, FREE, TOTAL, SHBG
SEX HORMONE BINDING: 50 nmol/L (ref 22–77)
TESTOSTERONE FREE: 34.6 pg/mL — AB (ref 47.0–244.0)
TESTOSTERONE-% FREE: 1.5 % — AB (ref 1.6–2.9)
Testosterone: 236 ng/dL — ABNORMAL LOW (ref 300–890)

## 2014-09-19 LAB — URINALYSIS, ROUTINE W REFLEX MICROSCOPIC
GLUCOSE, UA: NEGATIVE mg/dL
Hgb urine dipstick: NEGATIVE
Leukocytes, UA: NEGATIVE
Nitrite: NEGATIVE
PH: 5 (ref 5.0–8.0)
Protein, ur: NEGATIVE mg/dL
SPECIFIC GRAVITY, URINE: 1.028 (ref 1.005–1.030)
Urobilinogen, UA: 0.2 mg/dL (ref 0.0–1.0)

## 2014-09-30 ENCOUNTER — Encounter: Payer: Self-pay | Admitting: *Deleted

## 2015-03-14 ENCOUNTER — Ambulatory Visit: Payer: Self-pay

## 2015-04-03 ENCOUNTER — Ambulatory Visit: Payer: Self-pay

## 2015-04-25 ENCOUNTER — Telehealth: Payer: Self-pay | Admitting: Internal Medicine

## 2015-04-25 NOTE — Telephone Encounter (Signed)
  INTERNAL MEDICINE RESIDENCY PROGRAM After-Hours Telephone Call    Reason for call:   I received a call from [name] at 10:46 AM, 04/25/2015 indicating he is having problem with his prostate.    Pertinent Data:   Has hx of protatitis and diverticulitis, last episode of prostatitis was 2 years ago per patient. He states that since last 2 days he feels "something is wrong", feels uncomfortable half way between his belly button and his penis midline. No discharge from penis, no lesion on penis, no problem with urination. However, he does have some pain with bowel movements. He feels that his stools are small caliber or "fragmented". He feels as if this is similar to the "start" of his last prostatitis episode. Denies fever/chills, n/v or other episodes.  Wants appt at the clinic to also discuss starting testosterone to help with decreased libido.  last csope 12/2010 showed diverticulitis, asked to have f/up cscope in 5 years.  He wants to discuss about getting another csope as well in the clinic.   Denies any rectal bleeding or weight loss.      Assessment / Plan / Recommendations:   Ddx: constipation, diverticulosis, less likely prostatitis..   Advised patient to go to the urgent care to be evaluated for prostatitis as this would require abx treatment rather than waiting until the earliest Monday for clinic appt.   Left message with front desk pool to give pt an appt.   As always, pt is advised that if symptoms worsen or new symptoms arise, they should go to an urgent care facility or to to ER for further evaluation.    [name], MD   04/25/2015, 10:46 AM

## 2015-04-29 ENCOUNTER — Ambulatory Visit (INDEPENDENT_AMBULATORY_CARE_PROVIDER_SITE_OTHER): Payer: Self-pay | Admitting: Internal Medicine

## 2015-04-29 ENCOUNTER — Encounter: Payer: Self-pay | Admitting: Internal Medicine

## 2015-04-29 DIAGNOSIS — N419 Inflammatory disease of prostate, unspecified: Secondary | ICD-10-CM

## 2015-04-29 DIAGNOSIS — R6882 Decreased libido: Secondary | ICD-10-CM

## 2015-04-29 DIAGNOSIS — Z720 Tobacco use: Secondary | ICD-10-CM

## 2015-04-29 DIAGNOSIS — M71329 Other bursal cyst, unspecified elbow: Secondary | ICD-10-CM | POA: Insufficient documentation

## 2015-04-29 DIAGNOSIS — R3915 Urgency of urination: Secondary | ICD-10-CM

## 2015-04-29 DIAGNOSIS — M71322 Other bursal cyst, left elbow: Secondary | ICD-10-CM

## 2015-04-29 DIAGNOSIS — IMO0001 Reserved for inherently not codable concepts without codable children: Secondary | ICD-10-CM

## 2015-04-29 DIAGNOSIS — R030 Elevated blood-pressure reading, without diagnosis of hypertension: Secondary | ICD-10-CM

## 2015-04-29 MED ORDER — DOCUSATE SODIUM 100 MG PO CAPS: 100.0000 mg | ORAL_CAPSULE | Freq: Every day | ORAL | Status: DC | PRN

## 2015-04-29 NOTE — Assessment & Plan Note (Signed)
BP Readings from Last 3 Encounters:  04/29/15 135/71  09/18/14 140/89  03/06/13 123/82    Lab Results  Component Value Date   NA 140 09/18/2014   K 4.4 09/18/2014   CREATININE 0.93 09/18/2014   Blood pressure is 135/71 today. No need to start medications at this time. Will continue to monitor.

## 2015-04-29 NOTE — Assessment & Plan Note (Signed)
Chase Morgan has continue to gain weight steadily since his first visit in 2012. He weight 260 lbs at that time and weights 289 lbs today with a BMI of 40.5. We discussed diet and exercised today and encouraged weight loss. Declined meeting with Butch Penny. Will continue to encourage in the future.

## 2015-04-29 NOTE — Assessment & Plan Note (Signed)
He continues to complain of decreased libido, decreased energy and erectile dysfunction today. Denies any nocturnal tumescence. His testosterone was checked at 4/20 visit and was borderline low at 236. He is interested in starting testosterone supplementation.   Plan Guidelines recommend re-checking testosterone levels between 8-10 AM while fasting twice. Will schedule him to have labs drawn to further evaluation his testosterone.

## 2015-04-29 NOTE — Assessment & Plan Note (Addendum)
Chase Morgan complains of a cyst on his left elbow today. The cyst has been present for several years and has progressive gotten bigger. He endorses no pain but reports discomfort and would like it removed. The cyst is a large, 1.5 cm in diameter and is freely movable with no tenderness to palpation. Explained that this would likely recur even with removal but he reports he would like it excised.   -Will refer to surgery for cyst removal

## 2015-04-29 NOTE — Assessment & Plan Note (Signed)
  Assessment:  [name] reports he is smoking 12-15 cigarettes a day. Unchanged from previous. He has not tried the nicotine  Patches since his last visit. He reports he is going to make an effort to quit and will start using the patches.   Plan: Instruction/counseling given:  I counseled patient on the dangers of tobacco use, advised patient to stop smoking, and reviewed strategies to maximize success. Educational resources provided:  [facility] (1-800-QUIT-NOW) brochure Medications to assist with smoking cessation:  Nicotine Patch Patient agreed to the following self-care plans for smoking cessation: go to the [employer] (https://scott-booker.info/), cut down the number of cigarettes smoked  Other plans: Discussed using the patches and the gum for cravings. Will continue to monitor and encourage cessation.

## 2015-04-29 NOTE — Patient Instructions (Signed)
Thank you for coming in today  - I am going to check your urine and blood work today to check for an infection. If you have an infection I will call you and give you a prescription for some antibiotics. If there are no signs of infection I would like to get some imaging to look for a source of your problem. - I would like you to come back in the morning to re-check your testosterone - I will refer you to a surgeon for the cyst on your elbow - Continue to work on quitting smoking - I would like to see you back in clinic in 2-3 months for follow up

## 2015-04-29 NOTE — Assessment & Plan Note (Addendum)
He reports for the past week he has had an "uncomfortable sensation" in his suprapubic area with no pain. He reports it is worst in the morning. Denies any difficulty urination, urinary frequency, dysuria or hematuria but does endorse urinary urgency. Does note that the discomfort is worse with bowel movements. He reports regular daily bowel movements with no hematochezia or melena. Does report straining with harder stools the past several days. Denies any fevers, chills, n/v or abdominal pain. Says that this feels similar to his previous episodes of prostatitis that have resolved with antibiotics. He is monogamous with no recent sexual activity.  He reports being seen by a urologist in the past and having been on Flomax at one point with no improvement in any of his symptoms. He reports no difficulties initiating urination and reports a good stream.  Plan He has no overt signs or symptoms of an active prostate infection today. Abdominal exam is completely benign. Will check a UA and CBC. If negative will consider abdominal imaging.   ADDENDUM -UA and CBC unremarkable, no signs of infection at this time -Will get Abdominal CT for further work up. If unrevealing will consider trial of ABX as it has been effective at relieving his symptoms in the past

## 2015-04-29 NOTE — Progress Notes (Signed)
Patient ID: Chase Morgan, male   DOB: 25-Nov-1955, 59 y.o.   MRN: [id number]   Subjective:   Patient ID: Chase Morgan male   DOB: January 06, 1956 59 y.o.   MRN: [id number]  HPI: [name] is a 59 y.o. male with a PMHx of diverticulitis and prostatitis presents to clinic today with complaints of abdominal discomfort and urinary urgency. He reports for the past week he has had an "uncomfortable sensation" in his suprapubic area with no pain. He reports it is worst in the morning. Denies any difficulty urination, urinary frequency, dysuria or hematuria but does endorse urinary urgency. Does note that the discomfort is worse with bowel movements. He reports regular daily bowel movements with no hematochezia or melena. Does report straining with harder stools the past several days. Denies any fevers, chills, n/v or abdominal pain. No penile discharge or lesions. Says that this feels similar to his previous episodes of prostatitis that have resolved with antibiotics. He is monogamous with no recent sexual activity.   Past Medical History  Diagnosis Date  . Prostatitis 2007    1-2 episodes/year, episodes typically resolve with short course of cipro  . Diverticulitis 2012    2 episodes in June-July 2012, will get colonoscopy from GI in Aug 2012  . Obesity   . Tobacco abuse   . BPH (benign prostatic hyperplasia)   . BCC (basal cell carcinoma of skin)     forehead  . Dysplastic nevus     back  . Dysplastic nevus     right back    Current Outpatient Prescriptions  Medication Sig Dispense Refill  . ciprofloxacin (CIPRO) 500 MG tablet Take 1 tablet (500 mg total) by mouth 2 (two) times daily. 20 tablet 0  . docusate sodium (COLACE) 100 MG capsule Take 1 capsule (100 mg total) by mouth daily as needed. 30 capsule 1  . metroNIDAZOLE (FLAGYL) 500 MG tablet Take 1 tablet (500 mg total) by mouth 3 (three) times daily. 30 tablet 0  . nicotine (NICODERM CQ - DOSED IN MG/24 HOURS) 21 mg/24hr patch  Place 1 patch (21 mg total) onto the skin daily. 45 patch 0  . Probiotic Product (PROBIOTIC FORMULA PO) Take 1 tablet by mouth daily.       No current facility-administered medications for this visit.   Family History  Problem Relation Age of Onset  . Hemochromatosis Mother   . Heart disease Father     s/p coronary bypass around age 16's  . Cancer Father     Colon cancer, roughly in age 36's  . Colon cancer Father    Social History   Social History  . Marital Status: Married    Spouse Name: N/A  . Number of Children: 0  . Years of Education: N/A   Occupational History  . Architect   .     Social History Main Topics  . Smoking status: Current Some Day Smoker -- 0.75 packs/day for 33 years    Types: Cigarettes  . Smokeless tobacco: Never Used     Comment: thinking about.ready to quit.  Wants patches for.  . Alcohol Use: 0.0 oz/week    2-4 Cans of beer per week     Comment: DRINKS VERY LITTLE IF ANY  . Drug Use: No  . Sexual Activity: Not on file   Other Topics Concern  . Not on file   Social History Narrative   Review of Systems: Review of Systems  Constitutional: Negative for fever,  chills and malaise/fatigue.  Gastrointestinal: Negative for nausea, vomiting, abdominal pain, diarrhea, constipation, blood in stool and melena.  Genitourinary: Positive for urgency. Negative for dysuria, frequency, hematuria and flank pain.  Musculoskeletal: Negative for joint pain.  Skin: Negative for rash.   Objective:  Physical Exam: Filed Vitals:   04/29/15 1534  BP: 135/71  Pulse: 76  Temp: 97.9 F (36.6 C)  TempSrc: Oral  Weight: 289 lb 1.6 oz (131.135 kg)  SpO2: 96%   PHYSICAL EXAM GENERAL- alert, co-operative, appears as stated age, not in any distress. HEENT- Atraumatic, normocephalic, PERRL, EOMI, oral mucosa appears moist CARDIAC- RRR, no murmurs, rubs or gallops. RESP- Moving equal volumes of air, and clear to auscultation bilaterally, no wheezes or  crackles. ABDOMEN- Soft, nontender, bowel sounds present. NEURO- No obvious Cr N abnormality. MSK - 1.5 cm freely movable, non-tender cyst on medial left elbow EXTREMITIES- pulse 2+, symmetric, no pedal edema. SKIN- Warm, dry, No rash or lesion. PSYCH- Normal mood and affect, appropriate thought content and speech.  Assessment & Plan:   Case discussed with Dr. Dareen Piano. Please refer to Problem based charting for further details of today's visit.

## 2015-04-30 LAB — CBC WITH DIFFERENTIAL/PLATELET
BASOS: 0 %
Basophils Absolute: 0 10*3/uL (ref 0.0–0.2)
EOS (ABSOLUTE): 0.1 10*3/uL (ref 0.0–0.4)
EOS: 2 %
HEMATOCRIT: 45.7 % (ref 37.5–51.0)
Hemoglobin: 15.3 g/dL (ref 12.6–17.7)
IMMATURE GRANS (ABS): 0 10*3/uL (ref 0.0–0.1)
IMMATURE GRANULOCYTES: 0 %
LYMPHS: 36 %
Lymphocytes Absolute: 2.8 10*3/uL (ref 0.7–3.1)
MCH: 29.6 pg (ref 26.6–33.0)
MCHC: 33.5 g/dL (ref 31.5–35.7)
MCV: 88 fL (ref 79–97)
MONOS ABS: 0.8 10*3/uL (ref 0.1–0.9)
Monocytes: 10 %
NEUTROS ABS: 4 10*3/uL (ref 1.4–7.0)
Neutrophils: 52 %
PLATELETS: 208 10*3/uL (ref 150–379)
RBC: 5.17 x10E6/uL (ref 4.14–5.80)
RDW: 13.5 % (ref 12.3–15.4)
WBC: 7.8 10*3/uL (ref 3.4–10.8)

## 2015-04-30 LAB — URINALYSIS, ROUTINE W REFLEX MICROSCOPIC
Bilirubin, UA: NEGATIVE
GLUCOSE, UA: NEGATIVE
KETONES UA: NEGATIVE
LEUKOCYTES UA: NEGATIVE
Nitrite, UA: NEGATIVE
Protein, UA: NEGATIVE
RBC, UA: NEGATIVE
SPEC GRAV UA: 1.023 (ref 1.005–1.030)
Urobilinogen, Ur: 0.2 mg/dL (ref 0.2–1.0)
pH, UA: 5 (ref 5.0–7.5)

## 2015-04-30 NOTE — Addendum Note (Signed)
Addended by: Lennox Lions on: 04/30/2015 11:39 AM   Modules accepted: Orders

## 2015-04-30 NOTE — Progress Notes (Signed)
Internal Medicine Clinic Attending  I saw and evaluated the patient.  I personally confirmed the key portions of the history and exam documented by Dr. Boswell and I reviewed pertinent patient test results.  The assessment, diagnosis, and plan were formulated together and I agree with the documentation in the resident's note. 

## 2015-05-07 ENCOUNTER — Telehealth: Payer: Self-pay | Admitting: *Deleted

## 2015-05-07 NOTE — Telephone Encounter (Signed)
Please notify patient he has been sch on 05/09/2015 @ 2pm with an arrival time on 1:45pm. Patient will also need to pick up his Contrast @ Radiology prior to this appointment. Patient is to be NPO 4 hrs prior to appointment and to drink 1st contrast @ 12pm and the 2nd one @ 1p.m.   Detailed message left on pt's recorder.  Left call back number to [facility] and to radiology dept if pt has any questions or needs to reschedule the CT.  Phone call complete.Regenia Skeeter, Arayla Kruschke Cassady12/7/201611:23 AM

## 2015-05-09 ENCOUNTER — Encounter (HOSPITAL_COMMUNITY): Payer: Self-pay

## 2015-05-09 ENCOUNTER — Encounter: Payer: Self-pay | Admitting: Internal Medicine

## 2015-05-09 ENCOUNTER — Telehealth: Payer: Self-pay | Admitting: Internal Medicine

## 2015-05-09 ENCOUNTER — Ambulatory Visit (HOSPITAL_COMMUNITY)
Admission: RE | Admit: 2015-05-09 | Discharge: 2015-05-09 | Disposition: A | Discharge status: Home / Self Care | Payer: Self-pay | Source: Ambulatory Visit | Attending: Internal Medicine | Admitting: Internal Medicine

## 2015-05-09 ENCOUNTER — Other Ambulatory Visit: Payer: Self-pay | Admitting: Internal Medicine

## 2015-05-09 DIAGNOSIS — R1032 Left lower quadrant pain: Secondary | ICD-10-CM

## 2015-05-09 DIAGNOSIS — R3915 Urgency of urination: Secondary | ICD-10-CM

## 2015-05-09 DIAGNOSIS — R933 Abnormal findings on diagnostic imaging of other parts of digestive tract: Secondary | ICD-10-CM | POA: Insufficient documentation

## 2015-05-09 DIAGNOSIS — R590 Localized enlarged lymph nodes: Secondary | ICD-10-CM | POA: Insufficient documentation

## 2015-05-09 DIAGNOSIS — R109 Unspecified abdominal pain: Secondary | ICD-10-CM | POA: Insufficient documentation

## 2015-05-09 MED ORDER — METRONIDAZOLE 500 MG PO TABS: 500.0000 mg | ORAL_TABLET | Freq: Three times a day (TID) | ORAL | Status: DC

## 2015-05-09 MED ORDER — CIPROFLOXACIN HCL 500 MG PO TABS
500.0000 mg | ORAL_TABLET | Freq: Two times a day (BID) | ORAL | Status: DC
Start: 2015-05-09 — End: 2015-09-17

## 2015-05-09 MED ORDER — IOHEXOL 300 MG/ML  SOLN
100.0000 mL | Freq: Once | INTRAMUSCULAR | Status: AC | PRN
Start: 2015-05-09 — End: 2015-05-09
  Administered 2015-05-09: 100 mL via INTRAVENOUS

## 2015-05-09 NOTE — Telephone Encounter (Signed)
Spoke with Chase Morgan about his CT results. He reports no change in his symptoms. Will give him 7 day course of Cipro and Flagyl. Will refer to GI for diagnostic colonoscopy to rule out malignancy.  If no malignancy is present will discuss referral to surgery for potential hemi-colectomy. Follow up in clinic after colonoscopy or prn.

## 2015-05-09 NOTE — Progress Notes (Signed)
Patient ID: Chase Morgan, male   DOB: 28-Aug-1955, 59 y.o.   MRN: [id number]  Called by Dr. Nevada Crane, Radiology on 05/09/15 at 1650 concerning CT abdomen/pelvis results. Report was read as follows: "Severely abnormal nearly 20 cm segment of the sigmoid colon with wall thickening up to 2 cm. Confluent surrounding mesenteric stranding as well as adhesion to the nearby bladder dome which is moderately thickened. No gas within the bladder to suggest fistula at this time. No associated abscess. Mesenteric lymphadenopathy has increased since 2012. The findings in #1 are compatible with acute on chronic diverticulitis, however, follow-up is recommended to exclude colon carcinoma which can mimic this appearance and presentation."  In summary, patient has sigmoid colitis likely from recurrent diverticulitis. However, given degree of inflammation and progressive adenopathy, patient needs a colonoscopy for evaluation for colon cancer.   These results were discussed with Dr. Maryellen Pile, PGY-1, who originally saw the patient in clinic and ordered the CT scan and will be forwarded to the attending.   Osa Craver, DO PGY-2 Internal Medicine Resident Pager # [phone] 05/09/2015 5:01 PM

## 2015-05-13 ENCOUNTER — Encounter: Payer: Self-pay | Admitting: Gastroenterology

## 2015-06-30 ENCOUNTER — Telehealth: Payer: Self-pay | Admitting: Pulmonary Disease

## 2015-06-30 NOTE — Telephone Encounter (Signed)
Patient states Dr. Charlynn Grimes was suppose to schedule him an appt for surgery, has heard nothing since last visit

## 2015-07-17 ENCOUNTER — Ambulatory Visit: Payer: Self-pay | Admitting: Gastroenterology

## 2015-08-29 ENCOUNTER — Ambulatory Visit: Payer: Self-pay

## 2015-09-17 ENCOUNTER — Ambulatory Visit (INDEPENDENT_AMBULATORY_CARE_PROVIDER_SITE_OTHER): Payer: Self-pay | Admitting: Gastroenterology

## 2015-09-17 ENCOUNTER — Other Ambulatory Visit (INDEPENDENT_AMBULATORY_CARE_PROVIDER_SITE_OTHER): Payer: Self-pay

## 2015-09-17 ENCOUNTER — Encounter: Payer: Self-pay | Admitting: Gastroenterology

## 2015-09-17 VITALS — BP 124/80 | HR 76 | Ht 71.0 in | Wt 286.0 lb

## 2015-09-17 DIAGNOSIS — K5732 Diverticulitis of large intestine without perforation or abscess without bleeding: Secondary | ICD-10-CM

## 2015-09-17 DIAGNOSIS — R935 Abnormal findings on diagnostic imaging of other abdominal regions, including retroperitoneum: Secondary | ICD-10-CM

## 2015-09-17 DIAGNOSIS — Z8349 Family history of other endocrine, nutritional and metabolic diseases: Secondary | ICD-10-CM

## 2015-09-17 LAB — IBC PANEL
IRON: 90 ug/dL (ref 42–165)
Saturation Ratios: 25.5 % (ref 20.0–50.0)
TRANSFERRIN: 252 mg/dL (ref 212.0–360.0)

## 2015-09-17 LAB — FERRITIN: Ferritin: 183 ng/mL (ref 22.0–322.0)

## 2015-09-17 MED ORDER — SUPREP BOWEL PREP KIT 17.5-3.13-1.6 GM/177ML PO SOLN: ORAL | Status: DC

## 2015-09-17 NOTE — Progress Notes (Signed)
HPI :  60 y/o male with a history of diverticulitis and prostatitis / BPH, here for a visit for history of diverticulitis and abnormal CT scan. He has not been seen by our office since 2012.   He reports being hospitalized for diverticulitis about 5 years ago. After this occurred he had a colonoscopy in 2015 showing inflammatory changes in the sigmoid concerning for resolving diverticulitis.  He is not sure if he has recurrence since that time as he has had multiple "flare ups" of lower abdominal pain, he is not sure due to prostatitis or diverticulitis. He thinks he had a flare up of symptoms of abdominal pain and fever in December with and had a CT scan done showing extensive thickening of the sigmoid colon, roughly 20cm segment, and mesenteric lymphadenopathy. Radiology is recommending colonoscopy to rule out colon cancer.    He currently feels well. He takes MOM which works for rare constipation. He denies chronic abdominal pains. He has one BM per day usually. He denies any blood in the stools. He denies any weight loss.   His father had colon cancer, he thinks around age late 73s or early 39s, he is not sure.  His mother had a history of hemochromatosis, he does not think he has been tested for this before.    Colonoscopy 12/2010 - diverticulosis, erythema of sigmoid colon, poor prep  Past Medical History  Diagnosis Date  . Prostatitis 2007    1-2 episodes/year, episodes typically resolve with short course of cipro  . Diverticulitis 2012    2 episodes in June-July 2012, will get colonoscopy from GI in Aug 2012  . Obesity   . Tobacco abuse   . BPH (benign prostatic hyperplasia)   . BCC (basal cell carcinoma of skin)     forehead  . Dysplastic nevus     back  . Dysplastic nevus     right back      Past Surgical History  Procedure Laterality Date  . Umbilical hernia repair  2010  . Colonoscopy    . Basal cell carcinoma excision     Family History  Problem Relation Age of  Onset  . Hemochromatosis Mother   . Heart disease Father     s/p coronary bypass around age 78's  . Colon cancer Father     Colon cancer, roughly in age 13's   Social History  Substance Use Topics  . Smoking status: Current Some Day Smoker -- 0.75 packs/day for 33 years    Types: Cigarettes  . Smokeless tobacco: Never Used     Comment: thinking about.ready to quit.  Wants patches for.  . Alcohol Use: 0.0 oz/week    2-4 Cans of beer per week     Comment: DRINKS VERY LITTLE IF ANY   Current Outpatient Prescriptions  Medication Sig Dispense Refill  . magnesium hydroxide (MILK OF MAGNESIA) 400 MG/5ML suspension Take by mouth as needed for mild constipation.    . nicotine (NICODERM CQ - DOSED IN MG/24 HOURS) 21 mg/24hr patch Place 1 patch (21 mg total) onto the skin daily. 45 patch 0  . Probiotic Product (PROBIOTIC FORMULA PO) Take 1 tablet by mouth daily.       No current facility-administered medications for this visit.   No Known Allergies   Review of Systems: All systems reviewed and negative except where noted in HPI.   Lab Results  Component Value Date   WBC 7.8 04/29/2015   HGB 16.3 09/18/2014  HCT 45.7 04/29/2015   MCV 88 04/29/2015   PLT 208 04/29/2015    Lab Results  Component Value Date   ALT 12 09/18/2014   AST 18 09/18/2014   ALKPHOS 99 09/18/2014   BILITOT 0.4 09/18/2014     Physical Exam: BP 124/80 mmHg  Pulse 76  Ht 5\' 11"  (1.803 m)  Wt 286 lb (129.729 kg)  BMI 39.91 kg/m2 Constitutional: Pleasant,well-developed, male in no acute distress. HEENT: Normocephalic and atraumatic. Conjunctivae are normal. No scleral icterus. Neck supple.  Cardiovascular: Normal rate, regular rhythm.  Pulmonary/chest: Effort normal and breath sounds normal. No wheezing, rales or rhonchi. Abdominal: Soft, nondistended / protuberant obese abdomen, nontender. Bowel sounds active throughout. There are no masses palpable. No hepatomegaly. Extremities: no  edema Lymphadenopathy: No cervical adenopathy noted. Neurological: Alert and oriented to person place and time. Skin: Skin is warm and dry. No rashes noted. Psychiatric: Normal mood and affect. Behavior is normal.   ASSESSMENT AND PLAN: 60 y/o male with history of diverticulitis who presented in December with abdominal pain, with CT findings showing what appears to be acute on chronic diverticulitis, however with lymphadenopathy and extensive thickening, need to rule out underlying neoplasm. His father had colon cancer and he is due for colon cancer screening regardless. I discussed colonoscopy with him which entailed risks / benefits of the procedure and aneshtesia. He wants to proceed with colonoscopy but he is having difficulty obtaining health insurance right now, paying out of pocket. Our staff will discuss cost / scheduling with him, and he hopes to have insurance soon to cover it. Further recommendations pending the results.   Of note, history of hemochromatosis in his mother, but he has never had prior screening for it. Normal LFTs. Will check TIBC and ferritin to ensure normal. He agreed.   [place], MD [facility] Pager [phone]

## 2015-09-17 NOTE — Patient Instructions (Addendum)
  Your physician has requested that you go to the basement for the following lab work before leaving today: IBC, Ferritin   You have been scheduled for a colonoscopy. Please follow written instructions given to you at your visit today.  We will call you to come back to pick up a prep kit.  If it gets to be a week before your procedure and you haven't heard from [place] please call us. If you use inhalers (even only as needed), please bring them with you on the day of your procedure.  Today we have given you information for the phone numbers to call for financial assistance.  If you get insurance please get that information to [place] prior to your colonoscopy.  I appreciate the opportunity to care for you.

## 2015-10-24 ENCOUNTER — Telehealth: Payer: Self-pay | Admitting: *Deleted

## 2015-10-24 NOTE — Telephone Encounter (Signed)
L/m for pt to pick up sample prep kit

## 2015-10-30 ENCOUNTER — Encounter: Payer: Self-pay | Admitting: Gastroenterology

## 2015-10-30 ENCOUNTER — Ambulatory Visit (AMBULATORY_SURGERY_CENTER): Payer: Self-pay | Admitting: Gastroenterology

## 2015-10-30 VITALS — BP 119/81 | HR 49 | Temp 98.6°F | Resp 10 | Ht 71.0 in | Wt 286.0 lb

## 2015-10-30 DIAGNOSIS — K5732 Diverticulitis of large intestine without perforation or abscess without bleeding: Secondary | ICD-10-CM

## 2015-10-30 DIAGNOSIS — D123 Benign neoplasm of transverse colon: Secondary | ICD-10-CM

## 2015-10-30 DIAGNOSIS — D124 Benign neoplasm of descending colon: Secondary | ICD-10-CM

## 2015-10-30 DIAGNOSIS — D125 Benign neoplasm of sigmoid colon: Secondary | ICD-10-CM

## 2015-10-30 DIAGNOSIS — D122 Benign neoplasm of ascending colon: Secondary | ICD-10-CM

## 2015-10-30 DIAGNOSIS — R935 Abnormal findings on diagnostic imaging of other abdominal regions, including retroperitoneum: Secondary | ICD-10-CM

## 2015-10-30 DIAGNOSIS — K635 Polyp of colon: Secondary | ICD-10-CM

## 2015-10-30 DIAGNOSIS — Z800 Family history of malignant neoplasm of digestive organs: Secondary | ICD-10-CM

## 2015-10-30 MED ORDER — SODIUM CHLORIDE 0.9 % IV SOLN: 500.0000 mL | INTRAVENOUS | Status: DC

## 2015-10-30 NOTE — Progress Notes (Signed)
Called to room to assist during endoscopic procedure.  Patient ID and intended procedure confirmed with present staff. Received instructions for my participation in the procedure from the performing physician.  

## 2015-10-30 NOTE — Patient Instructions (Signed)
Impression/recommendations:  Polyps (handout given) Diverticulosis (handout given) High Fiber Diet (handout given) Hemorrhoids (handout given)  No aspirin, aspirin containing products or NSAIDS for the next two weeks. Tylenol only if needed until 11/14/15.  YOU HAD AN ENDOSCOPIC PROCEDURE TODAY AT [facility]:   Refer to the procedure report that was given to you for any specific questions about what was found during the examination.  If the procedure report does not answer your questions, please call your gastroenterologist to clarify.  If you requested that your care partner not be given the details of your procedure findings, then the procedure report has been included in a sealed envelope for you to review at your convenience later.  YOU SHOULD EXPECT: Some feelings of bloating in the abdomen. Passage of more gas than usual.  Walking can help get rid of the air that was put into your GI tract during the procedure and reduce the bloating. If you had a lower endoscopy (such as a colonoscopy or flexible sigmoidoscopy) you may notice spotting of blood in your stool or on the toilet paper. If you underwent a bowel prep for your procedure, you may not have a normal bowel movement for a few days.  Please Note:  You might notice some irritation and congestion in your nose or some drainage.  This is from the oxygen used during your procedure.  There is no need for concern and it should clear up in a day or so.  SYMPTOMS TO REPORT IMMEDIATELY:   Following lower endoscopy (colonoscopy or flexible sigmoidoscopy):  Excessive amounts of blood in the stool  Significant tenderness or worsening of abdominal pains  Swelling of the abdomen that is new, acute  Fever of 100F or higher  For urgent or emergent issues, a gastroenterologist can be reached at any hour by calling [phone].   DIET: Your first meal following the procedure should be a small meal and then it is ok to progress  to your normal diet. Heavy or fried foods are harder to digest and may make you feel nauseous or bloated.  Likewise, meals heavy in dairy and vegetables can increase bloating.  Drink plenty of fluids but you should avoid alcoholic beverages for 24 hours.  ACTIVITY:  You should plan to take it easy for the rest of today and you should NOT DRIVE or use heavy machinery until tomorrow (because of the sedation medicines used during the test).    FOLLOW UP: Our staff will call the number listed on your records the next business day following your procedure to check on you and address any questions or concerns that you may have regarding the information given to you following your procedure. If we do not reach you, we will leave a message.  However, if you are feeling well and you are not experiencing any problems, there is no need to return our call.  We will assume that you have returned to your regular daily activities without incident.  If any biopsies were taken you will be contacted by phone or by letter within the next 1-3 weeks.  Please call us at [phone] if you have not heard about the biopsies in 3 weeks.    SIGNATURES/CONFIDENTIALITY: You and/or your care partner have signed paperwork which will be entered into your electronic medical record.  These signatures attest to the fact that that the information above on your After Visit Summary has been reviewed and is understood.  Full responsibility of the confidentiality  of this discharge information lies with you and/or your care-partner.

## 2015-10-30 NOTE — Progress Notes (Signed)
Report to PACU, RN, vss, BBS= Clear.  

## 2015-10-30 NOTE — Op Note (Signed)
[place] Patient Name: Chase Morgan Procedure Date: 10/30/2015 9:02 AM MRN: [id number] Endoscopist: Remo Lipps P. Havery Moros , MD Age: 60 Referring MD:  Date of Birth: Sep 30, 1955 Gender: Male Procedure:                Colonoscopy Indications:              Screening in patient at increased risk: Family                            history of 1st-degree relative with colorectal                            cancer before age 19 years, also with recent                            history of diverticulitis with abnormal CT scan of                            the abdomen with lymphadenopathy near the sigmoid                            colon Medicines:                Monitored Anesthesia Care Procedure:                Pre-Anesthesia Assessment:                           - Prior to the procedure, a History and Physical                            was performed, and patient medications and                            allergies were reviewed. The patient's tolerance of                            previous anesthesia was also reviewed. The risks                            and benefits of the procedure and the sedation                            options and risks were discussed with the patient.                            All questions were answered, and informed consent                            was obtained. Prior Anticoagulants: The patient has                            taken no previous anticoagulant or antiplatelet  agents. ASA Grade Assessment: II - A patient with                            mild systemic disease. After reviewing the risks                            and benefits, the patient was deemed in                            satisfactory condition to undergo the procedure.                           After obtaining informed consent, the colonoscope                            was passed under direct vision. Throughout the   procedure, the patient's blood pressure, pulse, and                            oxygen saturations were monitored continuously. The                            Model CF-HQ190L [id number]) scope was introduced                            through the anus and advanced to the the cecum,                            identified by appendiceal orifice and ileocecal                            valve. The colonoscopy was performed without                            difficulty. The patient tolerated the procedure                            well. The quality of the bowel preparation was                            adequate. The ileocecal valve, appendiceal orifice,                            and rectum were photographed. Scope In: 9:06:53 AM Scope Out: 9:35:17 AM Scope Withdrawal Time: 0 hours 22 minutes 44 seconds  Total Procedure Duration: 0 hours 28 minutes 24 seconds  Findings:                 The perianal and digital rectal examinations were                            normal.                           Many small and large-mouthed diverticula were found  in the left colon, associated with muscular                            hypertrophy and narrowed colon lumen, with some                            mild patchy superficial erythema.                           Four sessile polyps were found in the ascending                            colon. The polyps were 3 to 6 mm in size. These                            polyps were removed with a cold snare. Resection                            and retrieval were complete.                           Two sessile polyps were found in the transverse                            colon. The polyps were 3 to 5 mm in size. These                            polyps were removed with a cold snare. Resection                            and retrieval were complete.                           A 3 mm polyp was found in the descending colon. The                             polyp was sessile. The polyp was removed with a                            cold snare. Resection and retrieval were complete.                           A 7 mm polyp was found in the sigmoid colon. The                            polyp was semi-pedunculated. The polyp was removed                            with a hot snare. Resection and retrieval were                            complete.  Non-bleeding internal hemorrhoids were found during                            retroflexion.                           The exam was otherwise without abnormality. Complications:            No immediate complications. Estimated blood loss:                            Minimal. Estimated Blood Loss:     Estimated blood loss was minimal. Impression:               - Diverticulosis in the left colon as outlined                            above, which correlates with prior CT finding of                            diverticulitis.                           - Four 3 to 6 mm polyps in the ascending colon,                            removed with a cold snare. Resected and retrieved.                           - Two 3 to 5 mm polyps in the transverse colon,                            removed with a cold snare. Resected and retrieved.                           - One 3 mm polyp in the descending colon, removed                            with a cold snare. Resected and retrieved.                           - One 7 mm polyp in the sigmoid colon, removed with                            a hot snare. Resected and retrieved.                           - Non-bleeding internal hemorrhoids.                           - The examination was otherwise normal. Recommendation:           - Patient has a contact number available for  emergencies. The signs and symptoms of potential                            delayed complications were discussed with the                             patient. Return to normal activities tomorrow.                            Written discharge instructions were provided to the                            patient.                           - Resume previous diet.                           - Daily fiber supplementation                           - Continue present medications.                           - No aspirin, ibuprofen, naproxen, or other                            non-steroidal anti-inflammatory drugs for 2 weeks                            after polyp removal.                           - Await pathology results.                           - Repeat colonoscopy is recommended for                            surveillance. The colonoscopy date will be                            determined after pathology results from today's                            exam become available for review. Remo Lipps P. Jatniel Verastegui, MD 10/30/2015 9:43:11 AM This report has been signed electronically.

## 2015-10-31 ENCOUNTER — Telehealth: Payer: Self-pay | Admitting: *Deleted

## 2015-10-31 NOTE — Telephone Encounter (Signed)
  Follow up Call-  Call back number 10/30/2015  Post procedure Call Back phone  # [phone] cell  Permission to leave phone message Yes    [facility]

## 2015-11-05 ENCOUNTER — Encounter: Payer: Self-pay | Admitting: Gastroenterology

## 2016-10-06 NOTE — Addendum Note (Signed)
Addended by: Farrell Lions on: 10/06/2016 02:19 PM   Modules accepted: Orders

## 2016-10-19 ENCOUNTER — Other Ambulatory Visit: Payer: Self-pay | Admitting: Orthopedic Surgery

## 2016-10-19 DIAGNOSIS — M4726 Other spondylosis with radiculopathy, lumbar region: Secondary | ICD-10-CM

## 2016-10-26 ENCOUNTER — Ambulatory Visit
Admission: RE | Admit: 2016-10-26 | Discharge: 2016-10-26 | Disposition: A | Discharge status: Home / Self Care | Payer: BLUE CROSS/BLUE SHIELD | Source: Ambulatory Visit | Attending: Orthopedic Surgery | Admitting: Orthopedic Surgery

## 2016-10-26 DIAGNOSIS — M4726 Other spondylosis with radiculopathy, lumbar region: Secondary | ICD-10-CM

## 2016-10-29 DIAGNOSIS — I82402 Acute embolism and thrombosis of unspecified deep veins of left lower extremity: Secondary | ICD-10-CM

## 2016-10-29 HISTORY — DX: Acute embolism and thrombosis of unspecified deep veins of left lower extremity: I82.402

## 2017-08-29 DIAGNOSIS — D6851 Activated protein C resistance: Secondary | ICD-10-CM

## 2017-08-29 HISTORY — DX: Activated protein C resistance: D68.51

## 2018-11-16 ENCOUNTER — Encounter: Payer: Self-pay | Admitting: Gastroenterology

## 2019-06-10 ENCOUNTER — Ambulatory Visit (HOSPITAL_COMMUNITY)
Admission: EM | Admit: 2019-06-10 | Discharge: 2019-06-10 | Disposition: A | Discharge status: Home / Self Care | Payer: BLUE CROSS/BLUE SHIELD | Attending: Family Medicine | Admitting: Family Medicine

## 2019-06-10 ENCOUNTER — Encounter (HOSPITAL_COMMUNITY): Payer: Self-pay

## 2019-06-10 ENCOUNTER — Other Ambulatory Visit: Payer: Self-pay

## 2019-06-10 DIAGNOSIS — R109 Unspecified abdominal pain: Secondary | ICD-10-CM | POA: Insufficient documentation

## 2019-06-10 LAB — POCT URINALYSIS DIP (DEVICE)
Bilirubin Urine: NEGATIVE
Glucose, UA: NEGATIVE mg/dL
Ketones, ur: NEGATIVE mg/dL
Nitrite: NEGATIVE
Protein, ur: NEGATIVE mg/dL
Specific Gravity, Urine: 1.025 (ref 1.005–1.030)
Urobilinogen, UA: 0.2 mg/dL (ref 0.0–1.0)
pH: 5.5 (ref 5.0–8.0)

## 2019-06-10 MED ORDER — HYDROCODONE-ACETAMINOPHEN 5-325 MG PO TABS: 2.0000 | ORAL_TABLET | ORAL | 0 refills | Status: AC | PRN

## 2019-06-10 MED ORDER — CIPROFLOXACIN HCL 500 MG PO TABS: 500.0000 mg | ORAL_TABLET | Freq: Two times a day (BID) | ORAL | 0 refills | Status: AC

## 2019-06-10 NOTE — ED Triage Notes (Signed)
Pt presents with left side flank pain, urinary urgency and pain with urination since yesterday.

## 2019-06-10 NOTE — ED Provider Notes (Signed)
[place]    CSN: [id number] Arrival date & time: 06/10/19  1646      History   Chief Complaint Chief Complaint  Patient presents with  . Urinary Tract Infection  . Flank Pain    Left    HPI BERNARDO JODOIN is a 64 y.o. male.   Complains of left sided back pain.  It is nonradiating.  He thinks this might be related to a kidney stone or infection.  He has had a history of disc disease.  HPI  Past Medical History:  Diagnosis Date  . Allergy   . BCC (basal cell carcinoma of skin)    forehead  . BPH (benign prostatic hyperplasia)   . Diverticulitis 2012   2 episodes in June-July 2012, will get colonoscopy from GI in Aug 2012  . Dysplastic nevus    back  . Dysplastic nevus    right back   . Obesity   . Prostatitis 2007   1-2 episodes/year, episodes typically resolve with short course of cipro  . Tobacco abuse     Patient Active Problem List   Diagnosis Date Noted  . Other bursal cyst of elbow 04/29/2015  . Low testosterone 09/19/2014  . LLQ abdominal pain 09/18/2014  . BCC (basal cell carcinoma of face) 09/18/2014  . Decreased libido 09/18/2014  . Elevated blood pressure 09/18/2014  . Urinary urgency 09/18/2014  . Healthcare maintenance 06/05/2012  . Tobacco abuse 01/04/2011  . Morbid obesity ([place]) 01/04/2011  . Diverticulitis 11/27/2010  . Prostatitis 11/27/2010    Past Surgical History:  Procedure Laterality Date  . BASAL CELL CARCINOMA EXCISION    . COLONOSCOPY    . UMBILICAL HERNIA REPAIR  2010       Home Medications    Prior to Admission medications   Medication Sig Start Date End Date Taking? Authorizing Provider  magnesium hydroxide (MILK OF MAGNESIA) 400 MG/5ML suspension Take by mouth as needed for mild constipation.    [provider]  nicotine (NICODERM CQ - DOSED IN MG/24 HOURS) 21 mg/24hr patch Place 1 patch (21 mg total) onto the skin daily. Patient not taking: Reported on 10/30/2015 09/18/14   McLean-Scocuzza, Nino Glow, MD  Probiotic Product (PROBIOTIC FORMULA PO) Take 1 tablet by mouth daily.      [provider]    Family History Family History  Problem Relation Age of Onset  . Hemochromatosis Mother   . Heart disease Father        s/p coronary bypass around age 34's  . Colon cancer Father        Colon cancer, roughly in age 80's  . Pancreatic cancer Neg Hx   . Prostate cancer Neg Hx   . Rectal cancer Neg Hx   . Stomach cancer Neg Hx   . Esophageal cancer Neg Hx     Social History Social History   Tobacco Use  . Smoking status: Current Some Day Smoker    Packs/day: 0.75    Years: 33.00    Pack years: 24.75    Types: Cigarettes  . Smokeless tobacco: Never Used  . Tobacco comment: thinking about.ready to quit.  Wants patches for.  Substance Use Topics  . Alcohol use: Yes    Alcohol/week: 2.0 - 4.0 standard drinks    Types: 2 - 4 Cans of beer per week    Comment: DRINKS VERY LITTLE IF ANY  . Drug use: No     Allergies   Patient has no  known allergies.   Review of Systems Review of Systems  Genitourinary: Positive for dysuria and frequency.  Musculoskeletal: Positive for back pain.  All other systems reviewed and are negative.    Physical Exam Triage Vital Signs ED Triage Vitals  Enc Vitals Group     BP 06/10/19 1824 (!) 151/90     Pulse Rate 06/10/19 1824 64     Resp 06/10/19 1824 17     Temp 06/10/19 1824 98.4 F (36.9 C)     Temp Source 06/10/19 1824 Oral     SpO2 06/10/19 1824 95 %     Weight --      Height --      Head Circumference --      Peak Flow --      Pain Score 06/10/19 1826 7     Pain Loc --      Pain Edu? --      Excl. in [place]? --    No data found.  Updated Vital Signs BP (!) 151/90 (BP Location: Right Arm)   Pulse 64   Temp 98.4 F (36.9 C) (Oral)   Resp 17   SpO2 95%   Visual Acuity Right Eye Distance:   Left Eye Distance:   Bilateral Distance:    Right Eye Near:   Left Eye Near:    Bilateral Near:     Physical Exam  Vitals and nursing note reviewed.  Constitutional:      Appearance: Normal appearance. He is obese.  Abdominal:     General: Abdomen is flat. Bowel sounds are normal.     Tenderness: There is no abdominal tenderness. There is no guarding or rebound.  Genitourinary:    Comments: No CVA tenderness Musculoskeletal:     Comments: Straight leg raising is negative  Neurological:     Mental Status: He is alert.      UC Treatments / Results  Labs (all labs ordered are listed, but only abnormal results are displayed) Labs Reviewed  POCT URINALYSIS DIP (DEVICE) - Abnormal; Notable for the following components:      Result Value   Hgb urine dipstick MODERATE (*)    Leukocytes,Ua TRACE (*)    All other components within normal limits    EKG   Radiology No results found.  Procedures Procedures (including critical care time)  Medications Ordered in UC Medications - No data to display  Initial Impression / Assessment and Plan / UC Course  I have reviewed the triage vital signs and the nursing notes.  Pertinent labs & imaging results that were available during my care of the patient were reviewed by me and considered in my medical decision making (see chart for details).     Left flank pain, rule out infection.  Urine is positive for blood and leukocytes will do culture and begin Cipro pending results of culture Final Clinical Impressions(s) / UC Diagnoses   Final diagnoses:  None   Discharge Instructions   None    ED Prescriptions    None     PDMP not reviewed this encounter.   Wardell Honour, MD 06/10/19 [phone]

## 2019-06-11 LAB — URINE CULTURE: Culture: NO GROWTH

## 2019-06-14 ENCOUNTER — Emergency Department (HOSPITAL_COMMUNITY)
Admission: EM | Admit: 2019-06-14 | Discharge: 2019-06-14 | Disposition: A | Discharge status: Home / Self Care | Payer: BC Managed Care – PPO | Attending: Emergency Medicine | Admitting: Emergency Medicine

## 2019-06-14 ENCOUNTER — Emergency Department (HOSPITAL_COMMUNITY): Payer: BC Managed Care – PPO

## 2019-06-14 ENCOUNTER — Encounter (HOSPITAL_COMMUNITY): Payer: Self-pay | Admitting: Emergency Medicine

## 2019-06-14 ENCOUNTER — Other Ambulatory Visit: Payer: Self-pay

## 2019-06-14 DIAGNOSIS — R109 Unspecified abdominal pain: Secondary | ICD-10-CM | POA: Insufficient documentation

## 2019-06-14 DIAGNOSIS — F17210 Nicotine dependence, cigarettes, uncomplicated: Secondary | ICD-10-CM | POA: Diagnosis not present

## 2019-06-14 LAB — COMPREHENSIVE METABOLIC PANEL
ALT: 23 U/L (ref 0–44)
AST: 21 U/L (ref 15–41)
Albumin: 4 g/dL (ref 3.5–5.0)
Alkaline Phosphatase: 89 U/L (ref 38–126)
Anion gap: 10 (ref 5–15)
BUN: 15 mg/dL (ref 8–23)
CO2: 23 mmol/L (ref 22–32)
Calcium: 8.9 mg/dL (ref 8.9–10.3)
Chloride: 105 mmol/L (ref 98–111)
Creatinine, Ser: 1.12 mg/dL (ref 0.61–1.24)
GFR calc Af Amer: >60 mL/min (ref >60)
GFR calc non Af Amer: >60 mL/min (ref >60)
Glucose, Bld: 104 mg/dL — ABNORMAL HIGH (ref 70–99)
Potassium: 4.1 mmol/L (ref 3.5–5.1)
Sodium: 138 mmol/L (ref 135–145)
Total Bilirubin: 0.7 mg/dL (ref 0.3–1.2)
Total Protein: 7 g/dL (ref 6.5–8.1)

## 2019-06-14 LAB — URINALYSIS, ROUTINE W REFLEX MICROSCOPIC
Bilirubin Urine: NEGATIVE
Glucose, UA: NEGATIVE mg/dL
Hgb urine dipstick: NEGATIVE
Ketones, ur: NEGATIVE mg/dL
Leukocytes,Ua: NEGATIVE
Nitrite: NEGATIVE
Protein, ur: NEGATIVE mg/dL
Specific Gravity, Urine: 1.021 (ref 1.005–1.030)
pH: 5 (ref 5.0–8.0)

## 2019-06-14 LAB — CBC
HCT: 51.3 % (ref 39.0–52.0)
Hemoglobin: 16.5 g/dL (ref 13.0–17.0)
MCH: 30.2 pg (ref 26.0–34.0)
MCHC: 32.2 g/dL (ref 30.0–36.0)
MCV: 94 fL (ref 80.0–100.0)
Platelets: 205 10*3/uL (ref 150–400)
RBC: 5.46 MIL/uL (ref 4.22–5.81)
RDW: 14 % (ref 11.5–15.5)
WBC: 8.4 10*3/uL (ref 4.0–10.5)
nRBC: 0 % (ref 0.0–0.2)

## 2019-06-14 MED ORDER — LIDOCAINE 5 % EX PTCH: 1.0000 | MEDICATED_PATCH | CUTANEOUS | 0 refills | Status: AC

## 2019-06-14 MED ORDER — OXYCODONE HCL 5 MG PO TABS
5.0000 mg | ORAL_TABLET | Freq: Once | ORAL | Status: AC | PRN
  Administered 2019-06-14: 5 mg via ORAL
  Filled 2019-06-14: qty 1

## 2019-06-14 MED ORDER — CYCLOBENZAPRINE HCL 10 MG PO TABS: 10.0000 mg | ORAL_TABLET | Freq: Two times a day (BID) | ORAL | 0 refills | Status: AC | PRN

## 2019-06-14 MED FILL — CYCLOBENZAPRINE 10 MG TAB: 10 | 10 days supply | Qty: 20 | Fill #0

## 2019-06-14 MED FILL — LIDOCAINE PATCH 5%: 5 | 5 days supply | Qty: 5 | Fill #0

## 2019-06-14 NOTE — ED Provider Notes (Signed)
[facility] Emergency Department Provider Note MRN:  [id number]  Arrival date & time: 06/14/19     Chief Complaint   Flank Pain   History of Present Illness   Chase Morgan is a 64 y.o. year-old male with a history of prostatitis presenting to the ED with chief complaint of flank pain.  Left-sided flank pain for the past 4 days, sudden onset, constant.  Seen at urgent care, where they found blood in the urine, started him on ciprofloxacin.  Explains that ciprofloxacin is not helping the pain.  Denies fever, no chest pain or shortness of breath, no abdominal pain.  Pain is moderate, constant, no exacerbating or relieving factors.  No recent trauma, no bowel or bladder dysfunction, no numbness or weakness to the legs.  Review of Systems  A complete 10 system review of systems was obtained and all systems are negative except as noted in the HPI and PMH.   Patient's Health History    Past Medical History:  Diagnosis Date  . Allergy   . BCC (basal cell carcinoma of skin)    forehead  . BPH (benign prostatic hyperplasia)   . Diverticulitis 2012   2 episodes in June-July 2012, will get colonoscopy from GI in Aug 2012  . Dysplastic nevus    back  . Dysplastic nevus    right back   . Obesity   . Prostatitis 2007   1-2 episodes/year, episodes typically resolve with short course of cipro  . Tobacco abuse     Past Surgical History:  Procedure Laterality Date  . BASAL CELL CARCINOMA EXCISION    . COLONOSCOPY    . UMBILICAL HERNIA REPAIR  2010    Family History  Problem Relation Age of Onset  . Hemochromatosis Mother   . Heart disease Father        s/p coronary bypass around age 56's  . Colon cancer Father        Colon cancer, roughly in age 21's  . Pancreatic cancer Neg Hx   . Prostate cancer Neg Hx   . Rectal cancer Neg Hx   . Stomach cancer Neg Hx   . Esophageal cancer Neg Hx     Social History   Socioeconomic History  . Marital status:  Married    Spouse name: Not on file  . Number of children: 0  . Years of education: Not on file  . Highest education level: Not on file  Occupational History  . Occupation: Agricultural consultant: [employer]  Tobacco Use  . Smoking status: Current Some Day Smoker    Packs/day: 0.75    Years: 33.00    Pack years: 24.75    Types: Cigarettes  . Smokeless tobacco: Never Used  . Tobacco comment: thinking about.ready to quit.  Wants patches for.  Substance and Sexual Activity  . Alcohol use: Yes    Alcohol/week: 2.0 - 4.0 standard drinks    Types: 2 - 4 Cans of beer per week    Comment: DRINKS VERY LITTLE IF ANY  . Drug use: No  . Sexual activity: Not on file  Other Topics Concern  . Not on file  Social History Narrative  . Not on file   Social Determinants of Health   Financial Resource Strain:   . Difficulty of Paying Living Expenses: Not on file  Food Insecurity:   . Worried About Charity fundraiser in the Last Year: Not on file  .  Ran Out of Food in the Last Year: Not on file  Transportation Needs:   . Lack of Transportation (Medical): Not on file  . Lack of Transportation (Non-Medical): Not on file  Physical Activity:   . Days of Exercise per Week: Not on file  . Minutes of Exercise per Session: Not on file  Stress:   . Feeling of Stress : Not on file  Social Connections:   . Frequency of Communication with Friends and Family: Not on file  . Frequency of Social Gatherings with Friends and Family: Not on file  . Attends Religious Services: Not on file  . Active Member of Clubs or Organizations: Not on file  . Attends Archivist Meetings: Not on file  . Marital Status: Not on file  Intimate Partner Violence:   . Fear of Current or Ex-Partner: Not on file  . Emotionally Abused: Not on file  . Physically Abused: Not on file  . Sexually Abused: Not on file     Physical Exam  Vital Signs and Nursing Notes reviewed Vitals:   06/14/19 1302  06/14/19 1304  BP: (!) 153/95   Pulse: (!) 56   Resp: 18 18  Temp:    SpO2: 98%     CONSTITUTIONAL: Well-appearing, NAD NEURO:  Alert and oriented x 3, no focal deficits EYES:  eyes equal and reactive ENT/NECK:  no LAD, no JVD CARDIO: Regular rate, well-perfused, normal S1 and S2 PULM:  CTAB no wheezing or rhonchi GI/GU:  normal bowel sounds, non-distended, non-tender MSK/SPINE:  No gross deformities, no edema SKIN:  no rash, atraumatic PSYCH:  Appropriate speech and behavior  Diagnostic and Interventional Summary    EKG Interpretation  Date/Time:    Ventricular Rate:    PR Interval:    QRS Duration:   QT Interval:    QTC Calculation:   R Axis:     Text Interpretation:        Labs Reviewed  COMPREHENSIVE METABOLIC PANEL - Abnormal; Notable for the following components:      Result Value   Glucose, Bld 104 (*)    All other components within normal limits  CBC  URINALYSIS, ROUTINE W REFLEX MICROSCOPIC    CT Renal Stone  Final Result      Medications  oxyCODONE (Oxy IR/ROXICODONE) immediate release tablet 5 mg (5 mg Oral Given 06/14/19 1132)     Procedures  /  Critical Care Procedures  ED Course and Medical Decision Making  I have reviewed the triage vital signs, the nursing notes, and pertinent available records from the EMR.  Pertinent labs & imaging results that were available during my care of the patient were reviewed by me and considered in my medical decision making (see below for details).     It seems that patient was treated empirically for prostatitis as he has a history of experiencing prostatitis once or twice a year.  However patient is not getting improvement with ciprofloxacin, having continued left-sided flank pain that seems more consistent with kidney stone given the hematuria.  Upon chart review, this urinalysis obtained in urgent care did not show any signs of infection.  Awaiting labs, CT to confirm stone.  Work-up is unrevealing, no  stone, normal CT, normal kidney function, urinalysis is normal as well.  Question of stone that is already passed given the hematuria versus musculoskeletal pain.  No red flags to suggest myelopathy, appropriate for discharge.  Barth Kirks. Sedonia Small, [place]  [facility] [email or web address]  Final Clinical Impressions(s) / ED Diagnoses     ICD-10-CM   1. Flank pain  R10.9     ED Discharge Orders         Ordered    cyclobenzaprine (FLEXERIL) 10 MG tablet  2 times daily PRN     06/14/19 1349    lidocaine (LIDODERM) 5 %  Every 24 hours     06/14/19 1349           Discharge Instructions Discussed with and Provided to Patient:     Discharge Instructions     You were evaluated in the Emergency Department and after careful evaluation, we did not find any emergent condition requiring admission or further testing in the hospital.  Your exam/testing today was overall reassuring.  CT did not show any emergencies, no kidney stones.  You can try the numbing patches and Flexeril medication for pain.  We also recommend that you complete your course of ciprofloxacin.  Please return to the Emergency Department if you experience any worsening of your condition.  We encourage you to follow up with a primary care provider.  Thank you for allowing [place] to be a part of your care.        Maudie Flakes, MD 06/14/19 1352

## 2019-06-14 NOTE — ED Triage Notes (Signed)
C/o L flank pain since the weekend.  States he was seen at [facility] and had hematuria.  Taking Cipro and states he hasn't improved.  Reports he took Vicodin for pain this morning.

## 2019-06-14 NOTE — Discharge Instructions (Addendum)
You were evaluated in the Emergency Department and after careful evaluation, we did not find any emergent condition requiring admission or further testing in the hospital.  Your exam/testing today was overall reassuring.  CT did not show any emergencies, no kidney stones.  You can try the numbing patches and Flexeril medication for pain.  We also recommend that you complete your course of ciprofloxacin.  Please return to the Emergency Department if you experience any worsening of your condition.  We encourage you to follow up with a primary care provider.  Thank you for allowing [place] to be a part of your care.

## 2019-06-14 NOTE — ED Notes (Signed)
Patient was given urine cup and is aware we need a sample

## 2020-01-17 ENCOUNTER — Encounter: Payer: Self-pay | Admitting: Gastroenterology

## 2020-03-25 ENCOUNTER — Ambulatory Visit: Payer: BLUE CROSS/BLUE SHIELD | Admitting: Gastroenterology
